# Patient Record
Sex: Female | Born: 1995 | Hispanic: Yes | Marital: Married | Smoking: Never smoker
Health system: Southern US, Community
[De-identification: ages and names within clinical notes are randomized; demographics above are authoritative.]

## PROBLEM LIST (undated history)

## (undated) ENCOUNTER — Inpatient Hospital Stay: Payer: Self-pay

## (undated) DIAGNOSIS — Z862 Personal history of diseases of the blood and blood-forming organs and certain disorders involving the immune mechanism: Secondary | ICD-10-CM

## (undated) HISTORY — DX: Personal history of diseases of the blood and blood-forming organs and certain disorders involving the immune mechanism: Z86.2

## (undated) HISTORY — PX: NO PAST SURGERIES: SHX2092

## (undated) HISTORY — PX: FEMUR FRACTURE SURGERY: SHX633

---
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
## 2017-03-05 IMAGING — US US OB COMP LESS 14 WK
1 series · 13 of 28 positions shown · non-contrast
Comparison: None.

CLINICAL DATA: Acute onset of light vaginal spotting and itching.
Initial encounter.

EXAM:
OBSTETRIC <14 WK US AND TRANSVAGINAL OB US
TECHNIQUE: Both transabdominal and transvaginal ultrasound examinations were
performed for complete evaluation of the gestation as well as the
maternal uterus, adnexal regions, and pelvic cul-de-sac.
Transvaginal technique was performed to assess early pregnancy.

[Series 1: us ob comp less 14 wk · 0.20mm/px · 53 acquisitions, 13 frames shown]
[im 2/53]
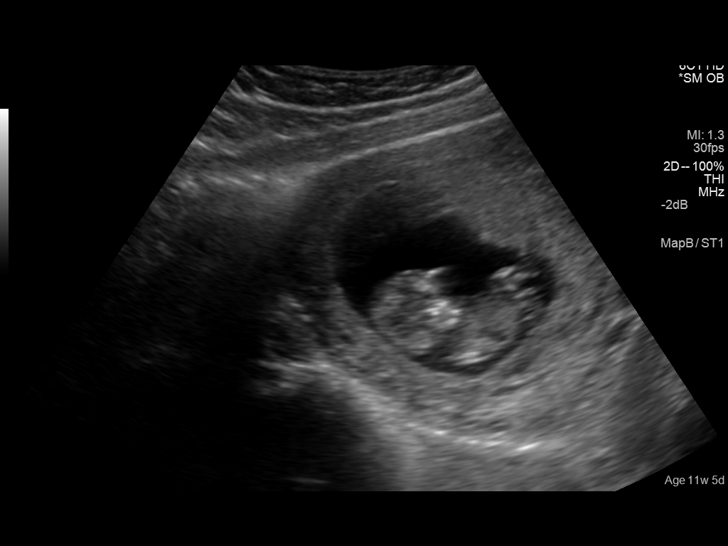
[im 6/53]
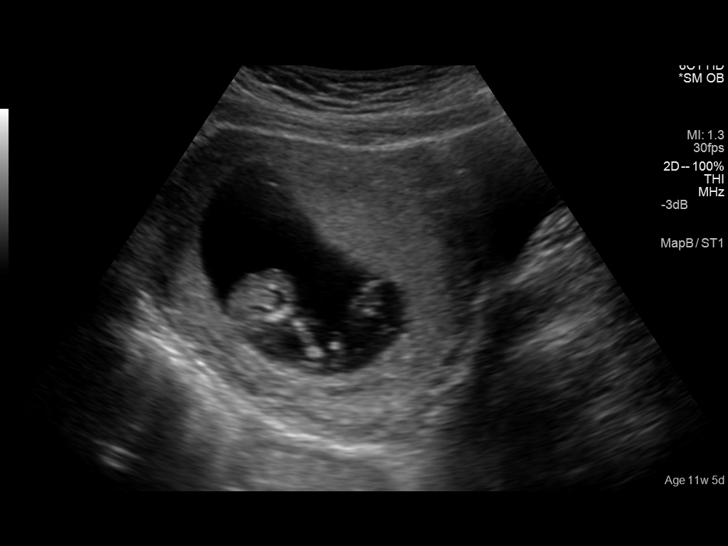
[im 10/53]
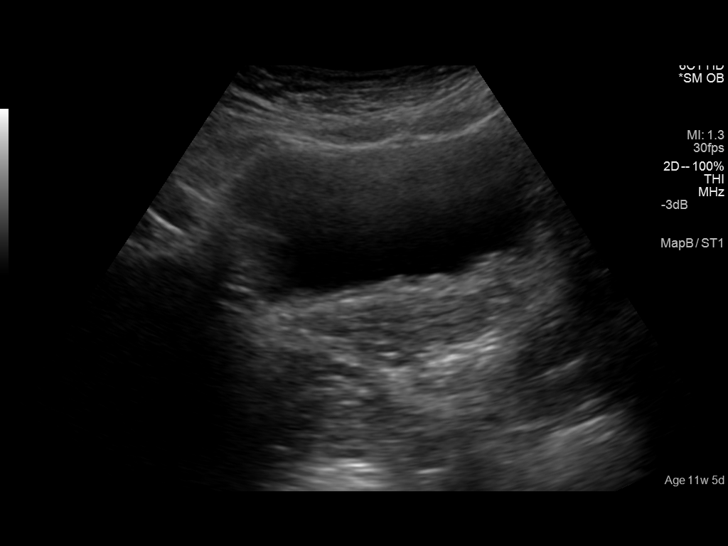
[im 14/53]
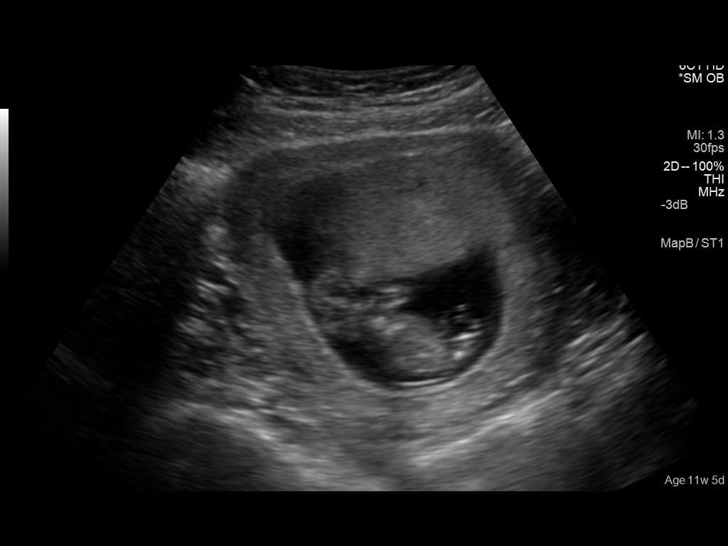
[im 18/53]
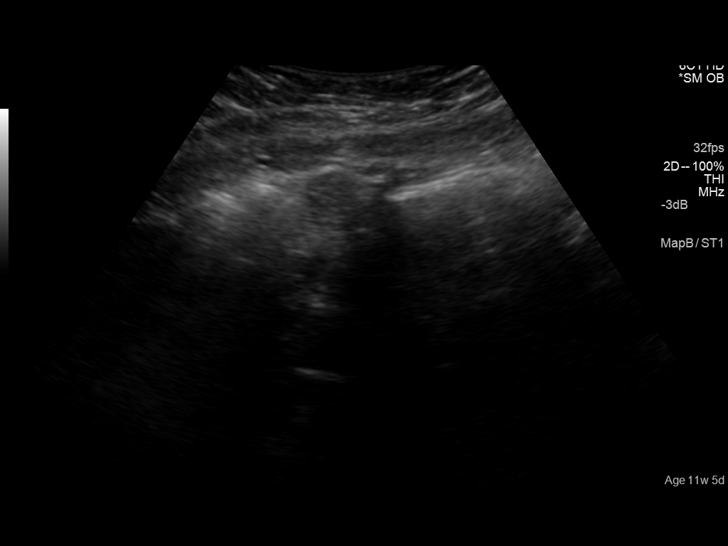
[im 22/53]
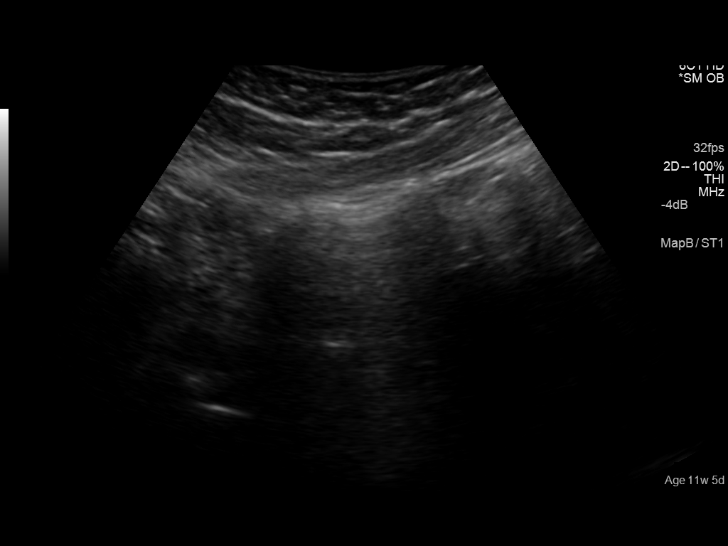
[im 27/53]
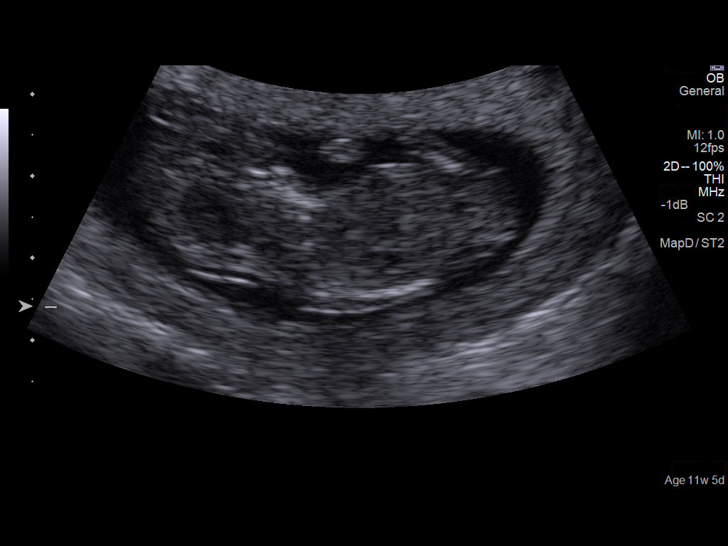
[im 31/53]
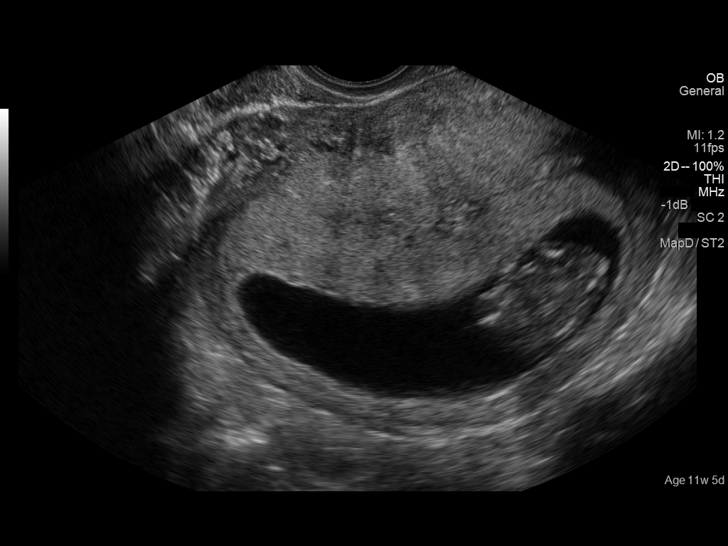
[im 35/53]
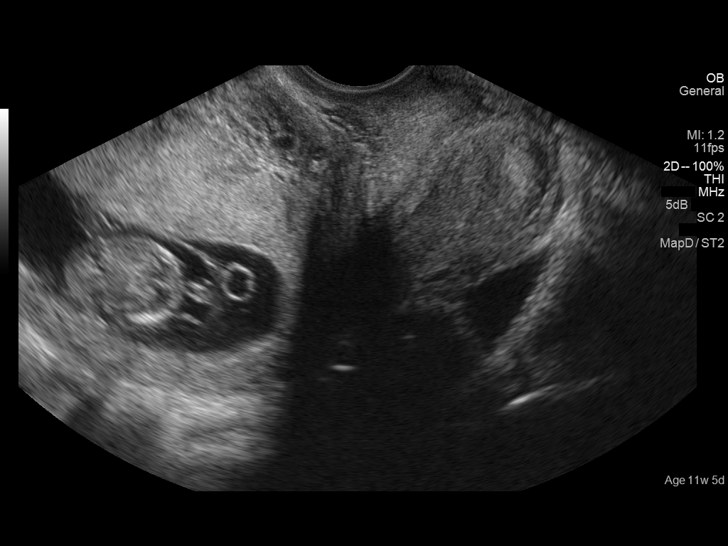
[im 39/53]
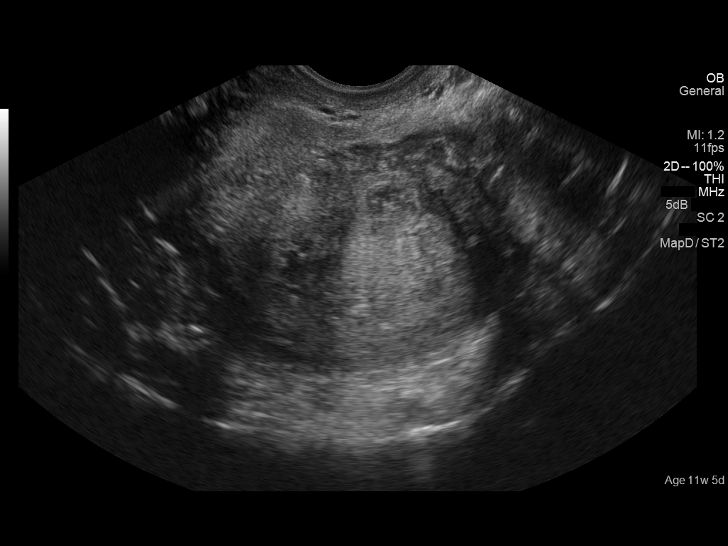
[im 43/53]
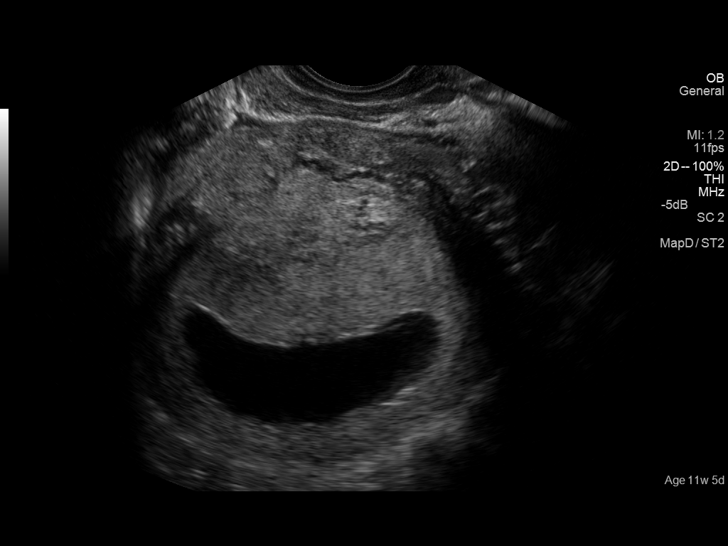
[im 47/53]
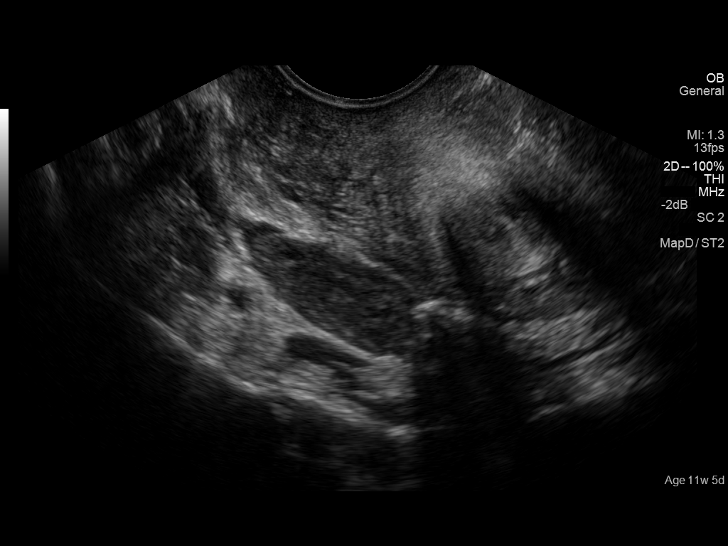
[im 51/53]
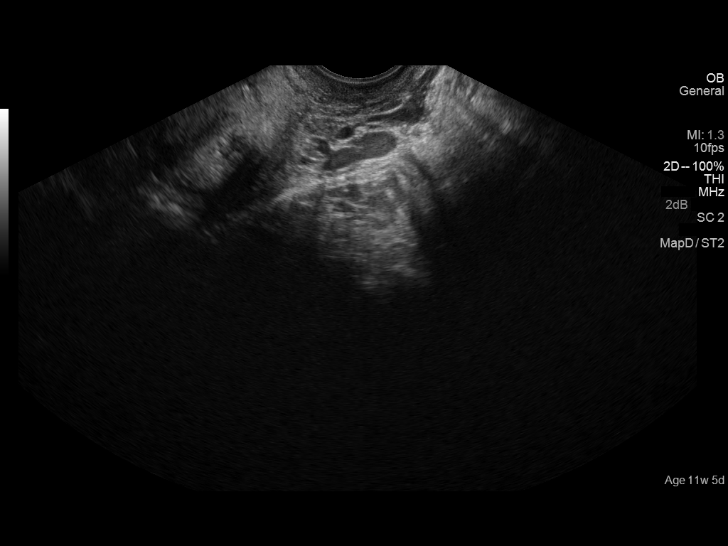

[13 of 28 positions shown; findings below may reference images not displayed]

FINDINGS: Intrauterine gestational sac: Visualized/normal in shape.

Yolk sac:  Yes

Embryo:  Yes

Cardiac Activity: Yes

Heart Rate: 152  bpm

CRL:  4.67 cm   11 w   3 d                  US EDC: 09/07/2015

Maternal uterus/adnexae: No subchorionic hemorrhage is noted. The
uterus is otherwise unremarkable in appearance.

The right ovary is unremarkable in appearance, measuring 2.6 x 1.2 x
1.6 cm. The left ovary is not visualized. No suspicious adnexal
masses are seen. There is no evidence for ovarian torsion.

No free fluid is seen within the pelvic cul-de-sac.
IMPRESSION: Single live intrauterine pregnancy noted, with a crown-rump length
of 4.7 cm, corresponding to a gestational age of 11 weeks 3 days.
This matches the gestational age by LMP of 11 weeks 5 days,
reflecting an estimated date of delivery September 05, 2015.

## 2017-03-16 ENCOUNTER — Ambulatory Visit (INDEPENDENT_AMBULATORY_CARE_PROVIDER_SITE_OTHER): Payer: Medicaid Other

## 2017-03-16 ENCOUNTER — Ambulatory Visit (INDEPENDENT_AMBULATORY_CARE_PROVIDER_SITE_OTHER): Payer: Medicaid Other | Admitting: Advanced Practice Midwife

## 2017-03-16 ENCOUNTER — Encounter: Payer: Self-pay | Admitting: Advanced Practice Midwife

## 2017-03-16 VITALS — BP 94/58 | HR 91 | Ht 60.0 in | Wt 125.0 lb

## 2017-03-16 DIAGNOSIS — Z3042 Encounter for surveillance of injectable contraceptive: Secondary | ICD-10-CM

## 2017-03-16 DIAGNOSIS — Z30013 Encounter for initial prescription of injectable contraceptive: Secondary | ICD-10-CM

## 2017-03-16 MED ORDER — MEDROXYPROGESTERONE ACETATE 150 MG/ML IM SUSP: 150.0000 mg | INTRAMUSCULAR | 3 refills | Status: DC

## 2017-03-16 MED ORDER — MEDROXYPROGESTERONE ACETATE 150 MG/ML IM SUSP
150.0000 mg | Freq: Once | INTRAMUSCULAR | Status: AC
  Administered 2017-03-16: 150 mg via INTRAMUSCULAR

## 2017-03-16 NOTE — Progress Notes (Signed)
S: The patient is here today for birth control conference. She has most recently had an IUD which was removed in September of this year and does not want to have another. She had increased cramping with the IUD. She is interested in trying Depo Provera. Discussion of various types of birth control including, LARCs, pill, patch, ring, injection, condoms. Her questions are answered and she chooses Depo. She plans to pick up the prescription and come back in this afternoon for her first injection. She had an annual exam in September. She does not have concerns for STDs or for pregnancy as she has not been sexually active in the last few months. She does not use tobacco products.  O: BP (!) 94/58 (BP Location: Left Arm, Patient Position: Sitting, Cuff Size: Normal)   Pulse 91   Ht 5' (1.524 m)   Wt 125 lb (56.7 kg)   LMP 02/25/2017   BMI 24.41 kg/m   A: 21 yo female in office for birth control consultation  P: Rx for Depo Provera Return to clinic for PRN and annual exam  Tresea MallJane Joleigh Mineau, CNM

## 2017-06-08 ENCOUNTER — Ambulatory Visit (INDEPENDENT_AMBULATORY_CARE_PROVIDER_SITE_OTHER): Payer: Medicaid Other

## 2017-06-08 DIAGNOSIS — Z3042 Encounter for surveillance of injectable contraceptive: Secondary | ICD-10-CM | POA: Diagnosis not present

## 2017-06-08 MED ORDER — MEDROXYPROGESTERONE ACETATE 150 MG/ML IM SUSP
150.0000 mg | Freq: Once | INTRAMUSCULAR | Status: AC
  Administered 2017-06-08: 150 mg via INTRAMUSCULAR

## 2017-06-19 ENCOUNTER — Encounter: Payer: Medicaid Other | Admitting: Obstetrics and Gynecology

## 2017-08-31 ENCOUNTER — Ambulatory Visit (INDEPENDENT_AMBULATORY_CARE_PROVIDER_SITE_OTHER): Payer: Medicaid Other

## 2017-08-31 ENCOUNTER — Ambulatory Visit: Payer: Medicaid Other

## 2017-08-31 DIAGNOSIS — Z3042 Encounter for surveillance of injectable contraceptive: Secondary | ICD-10-CM

## 2017-08-31 MED ORDER — MEDROXYPROGESTERONE ACETATE 150 MG/ML IM SUSP
150.0000 mg | Freq: Once | INTRAMUSCULAR | Status: AC
  Administered 2017-08-31: 150 mg via INTRAMUSCULAR

## 2017-09-18 IMAGING — US US ABDOMEN LIMITED
1 series · 14 of 25 positions shown · non-contrast
Comparison: None.

CLINICAL DATA: 21-year-old female with right upper quadrant pain x2
days.

EXAM:
US ABDOMEN LIMITED - RIGHT UPPER QUADRANT

[Series 1: us abdomen limited · 0.26mm/px · 14 of 62 slices shown]
[im 1/62]
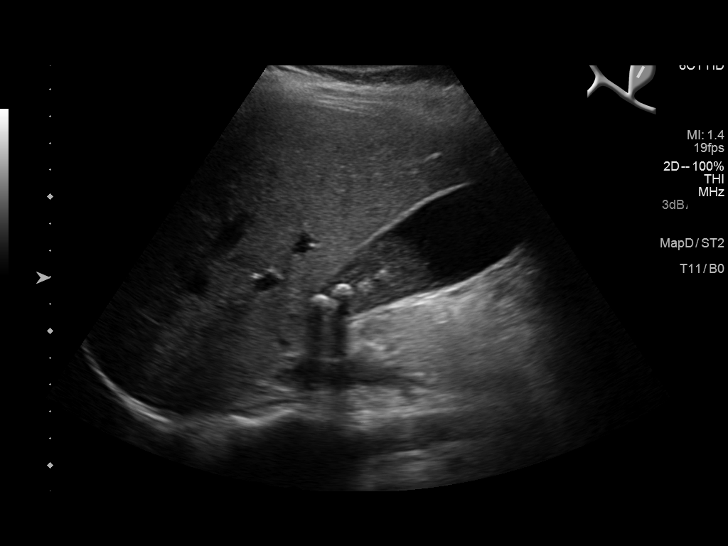
[im 6/62]
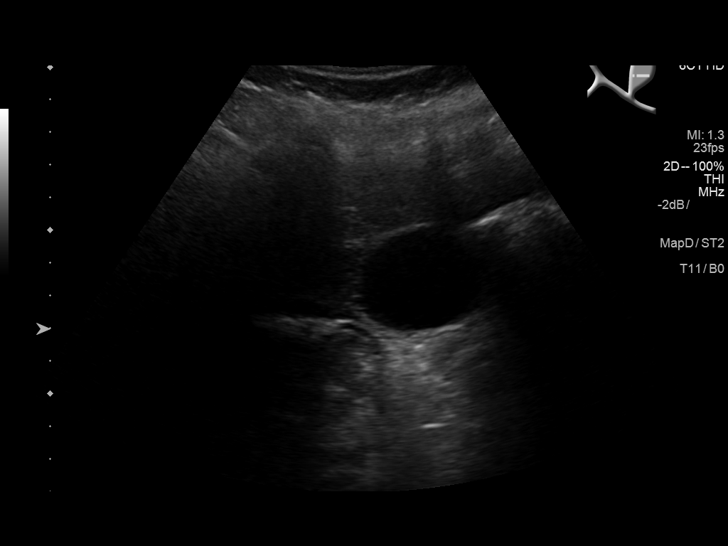
[im 11/62]
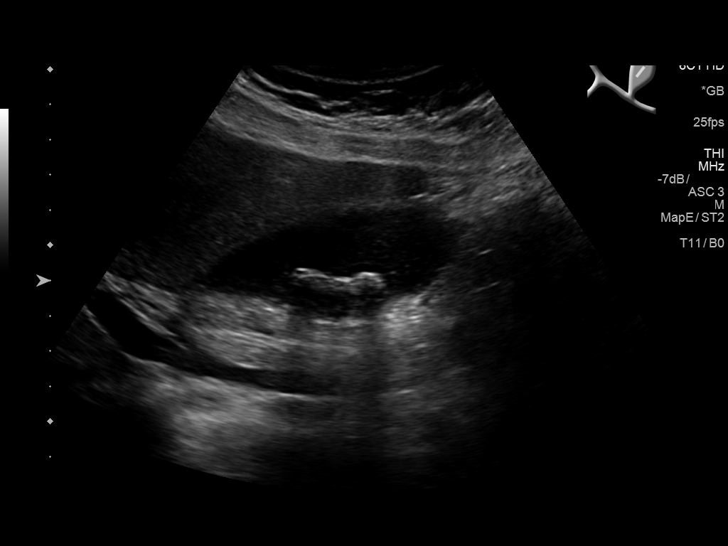
[im 16/62]
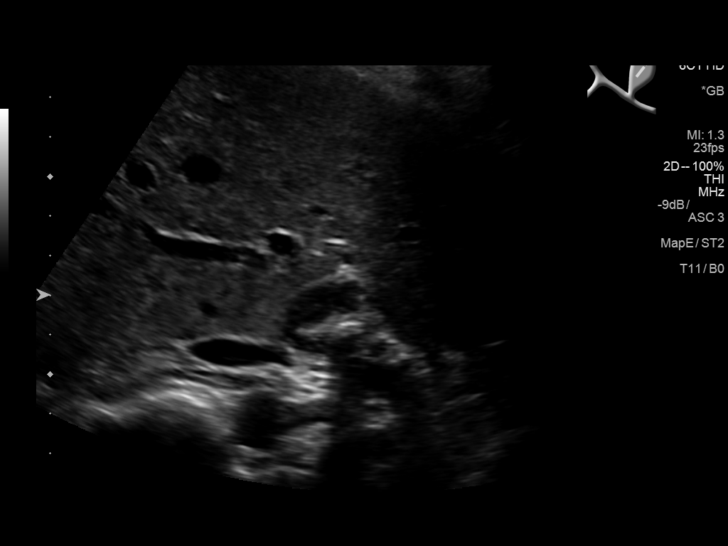
[im 21/62]
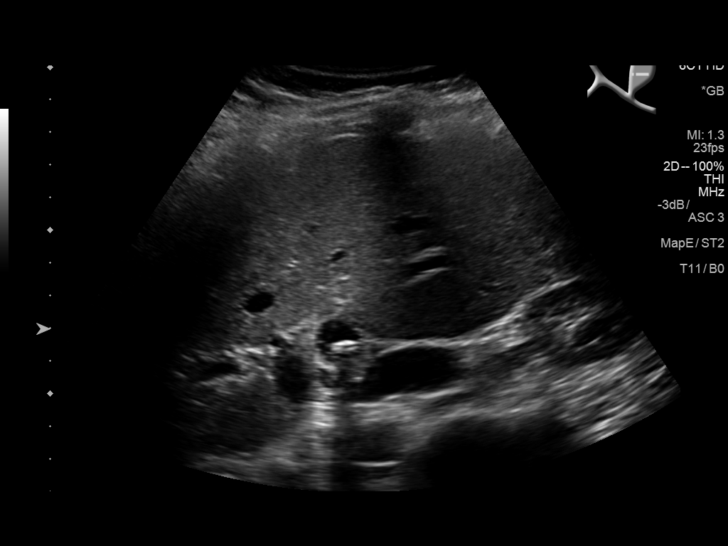
[im 23/62]
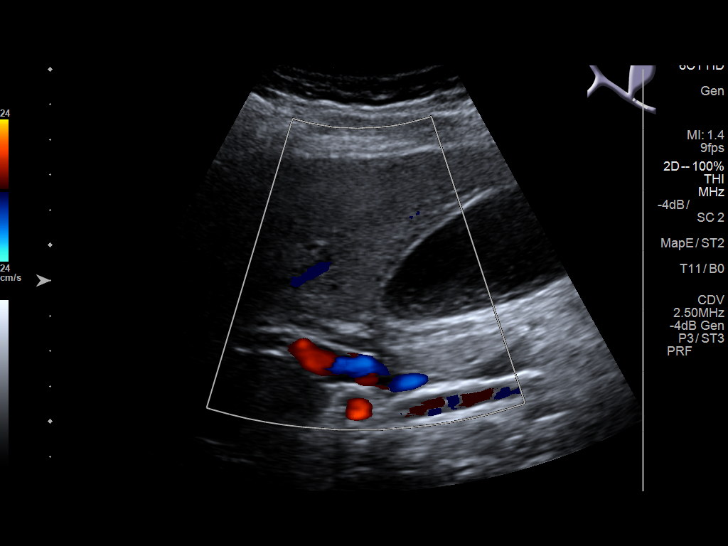
[im 28/62]
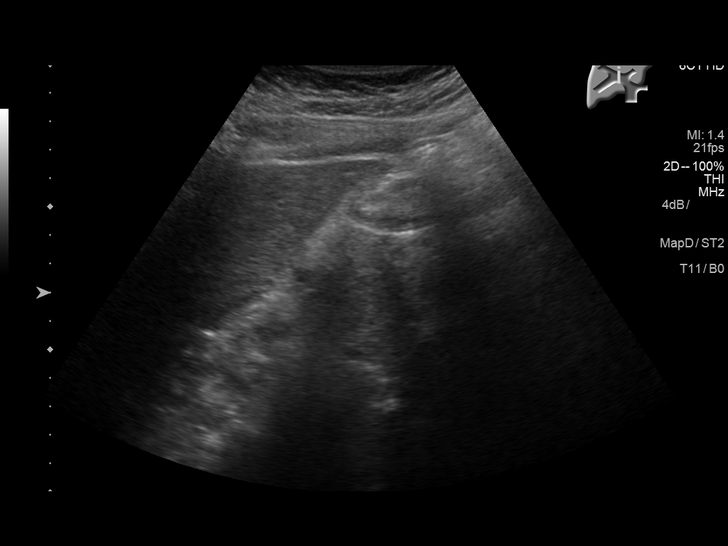
[im 34/62]
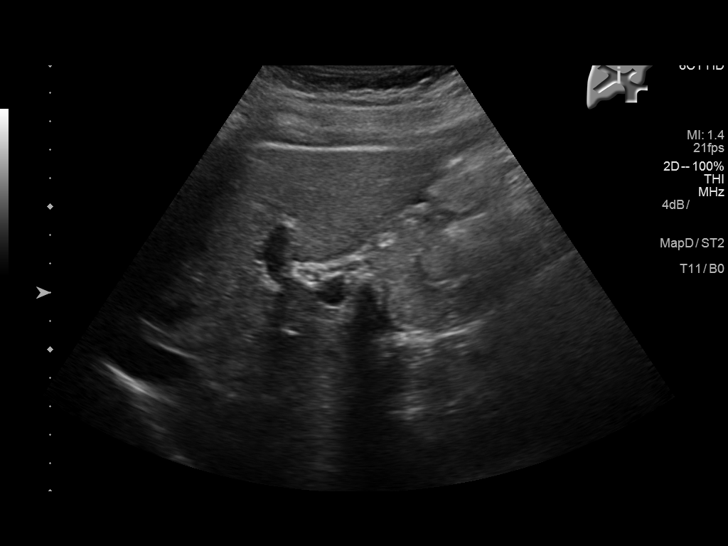
[im 39/62]
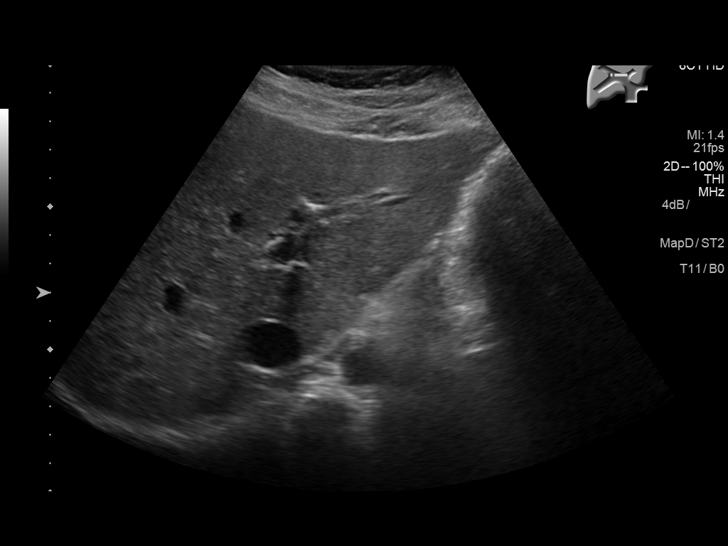
[im 41/62]
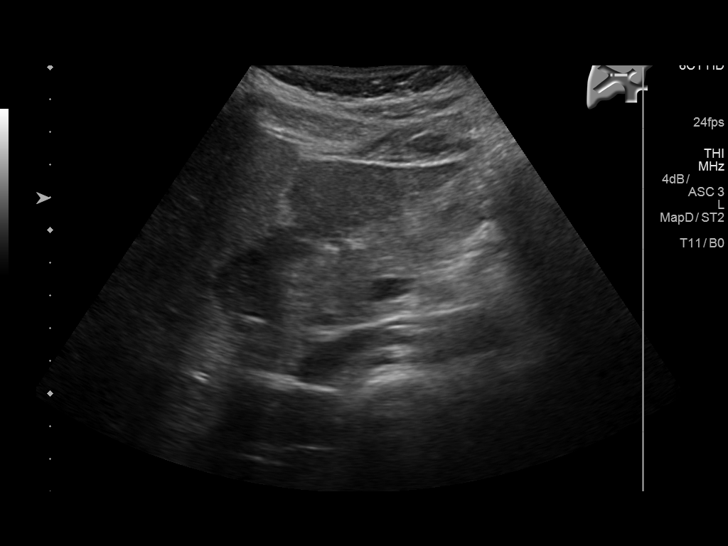
[im 46/62]
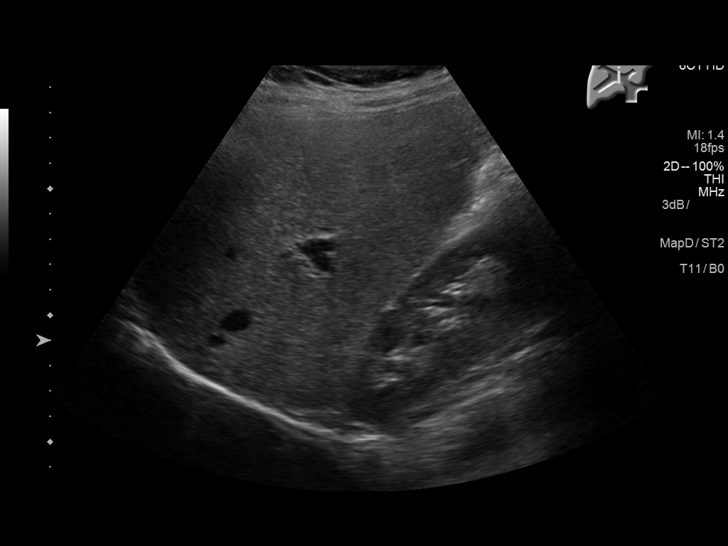
[im 51/62]
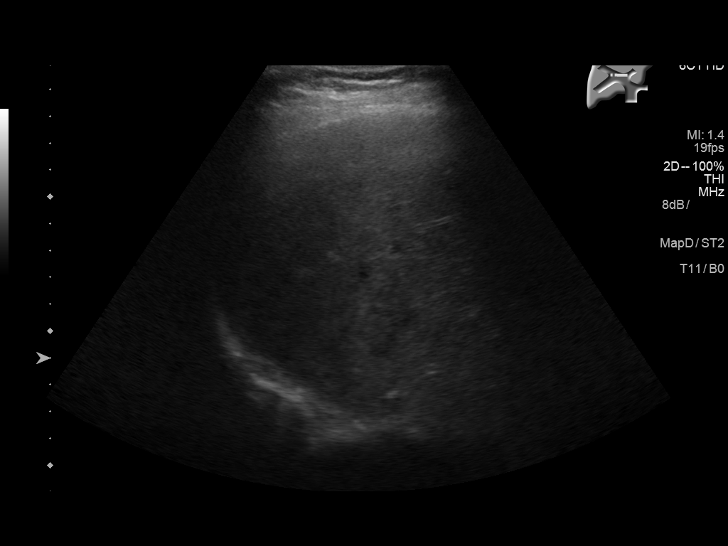
[im 56/62]
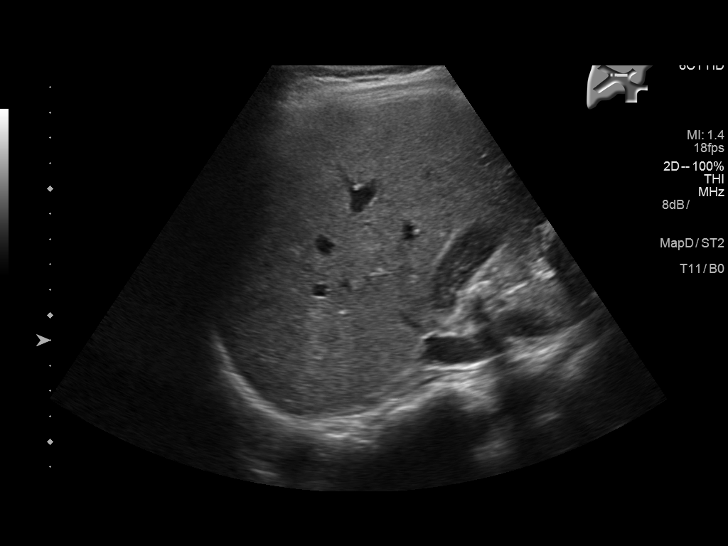
[im 62/62]
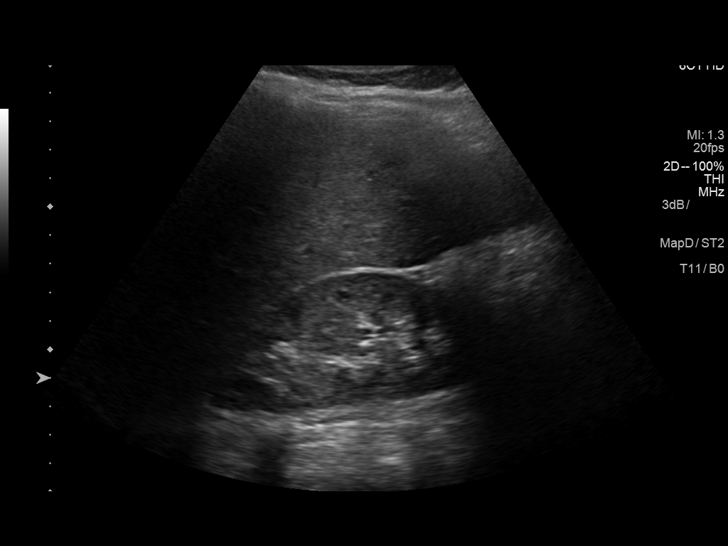

[14 of 25 positions shown; findings below may reference images not displayed]

FINDINGS: Gallbladder:

There multiple stones as well as sludge within the gallbladder.
There are two impacted stones in the gallbladder neck. There is no
gallbladder wall thickening or pericholecystic fluid. Negative
sonographic Murphy's sign.

Common bile duct:

Diameter: 4 mm

Liver:

No focal lesion identified. Within normal limits in parenchymal
echogenicity. The main portal vein is patent with appropriate flow
direction.
IMPRESSION: Cholelithiasis with impacted stones in the gallbladder neck. No
sonographic evidence of acute cholecystitis. A hepatobiliary
scintigraphy may provide better evaluation of the gallbladder if
there is a high clinical concern for acute cholecystitis .

## 2017-11-23 ENCOUNTER — Ambulatory Visit (INDEPENDENT_AMBULATORY_CARE_PROVIDER_SITE_OTHER): Payer: Medicaid Other

## 2017-11-23 ENCOUNTER — Ambulatory Visit: Payer: Medicaid Other

## 2017-11-23 DIAGNOSIS — Z3042 Encounter for surveillance of injectable contraceptive: Secondary | ICD-10-CM

## 2017-11-23 DIAGNOSIS — Z308 Encounter for other contraceptive management: Secondary | ICD-10-CM

## 2017-11-23 MED ORDER — MEDROXYPROGESTERONE ACETATE 150 MG/ML IM SUSP
150.0000 mg | Freq: Once | INTRAMUSCULAR | Status: AC
  Administered 2017-11-23: 150 mg via INTRAMUSCULAR

## 2017-11-23 NOTE — Progress Notes (Signed)
Pt here for depo which was given IM right glut.  NDC# [address]-2 

## 2018-02-15 ENCOUNTER — Ambulatory Visit: Payer: Medicaid Other

## 2018-04-03 HISTORY — PX: FEMUR FRACTURE SURGERY: SHX633

## 2018-05-09 ENCOUNTER — Ambulatory Visit: Payer: Medicaid Other

## 2018-05-10 ENCOUNTER — Telehealth: Payer: Self-pay | Admitting: Advanced Practice Midwife

## 2018-05-10 ENCOUNTER — Other Ambulatory Visit: Payer: Self-pay | Admitting: Advanced Practice Midwife

## 2018-05-10 ENCOUNTER — Ambulatory Visit: Payer: Medicaid Other

## 2018-05-10 DIAGNOSIS — Z30013 Encounter for initial prescription of injectable contraceptive: Secondary | ICD-10-CM

## 2018-05-10 NOTE — Telephone Encounter (Signed)
Patient is schedule for 05/24/18 with Annual with Erskine SquibbJane. Patient is requesting refill on depo. Please advise

## 2018-05-13 ENCOUNTER — Other Ambulatory Visit: Payer: Self-pay

## 2018-05-13 ENCOUNTER — Ambulatory Visit (INDEPENDENT_AMBULATORY_CARE_PROVIDER_SITE_OTHER): Payer: Medicaid Other

## 2018-05-13 DIAGNOSIS — Z3202 Encounter for pregnancy test, result negative: Secondary | ICD-10-CM

## 2018-05-13 DIAGNOSIS — Z3042 Encounter for surveillance of injectable contraceptive: Secondary | ICD-10-CM

## 2018-05-13 DIAGNOSIS — N912 Amenorrhea, unspecified: Secondary | ICD-10-CM

## 2018-05-13 DIAGNOSIS — Z30013 Encounter for initial prescription of injectable contraceptive: Secondary | ICD-10-CM

## 2018-05-13 LAB — POCT URINE PREGNANCY: PREG TEST UR: NEGATIVE

## 2018-05-13 MED ORDER — MEDROXYPROGESTERONE ACETATE 150 MG/ML IM SUSY: 1.0000 mL | PREFILLED_SYRINGE | INTRAMUSCULAR | 0 refills | Status: DC

## 2018-05-13 MED ORDER — MEDROXYPROGESTERONE ACETATE 150 MG/ML IM SUSP
150.0000 mg | Freq: Once | INTRAMUSCULAR | Status: AC
  Administered 2018-05-13: 150 mg via INTRAMUSCULAR

## 2018-05-13 NOTE — Telephone Encounter (Signed)
Refill sent.

## 2018-05-13 NOTE — Progress Notes (Signed)
Pt presents late for Depo Provera injection today. Pregnancy test performed per protocol (Negative). Injection given IM LUOQ. Pt tolerated well.

## 2018-05-24 ENCOUNTER — Ambulatory Visit: Payer: Medicaid Other | Admitting: Advanced Practice Midwife

## 2018-08-05 ENCOUNTER — Telehealth: Payer: Self-pay | Admitting: Advanced Practice Midwife

## 2018-08-05 ENCOUNTER — Other Ambulatory Visit: Payer: Self-pay | Admitting: Obstetrics and Gynecology

## 2018-08-05 ENCOUNTER — Ambulatory Visit: Payer: Medicaid Other

## 2018-08-05 DIAGNOSIS — Z30013 Encounter for initial prescription of injectable contraceptive: Secondary | ICD-10-CM

## 2018-08-05 NOTE — Telephone Encounter (Signed)
Advise

## 2018-08-05 NOTE — Telephone Encounter (Signed)
Patient is schedule 08/09/18 for Depo injection appointment. Patient is requesting refill on Depo. Please advise

## 2018-08-06 ENCOUNTER — Other Ambulatory Visit: Payer: Self-pay

## 2018-08-06 DIAGNOSIS — Z30013 Encounter for initial prescription of injectable contraceptive: Secondary | ICD-10-CM

## 2018-08-06 MED ORDER — MEDROXYPROGESTERONE ACETATE 150 MG/ML IM SUSY: 1.0000 mL | PREFILLED_SYRINGE | INTRAMUSCULAR | 0 refills | Status: DC

## 2018-08-09 ENCOUNTER — Ambulatory Visit: Payer: Medicaid Other

## 2019-07-14 ENCOUNTER — Encounter: Payer: Self-pay | Admitting: Obstetrics & Gynecology

## 2019-07-14 ENCOUNTER — Other Ambulatory Visit (HOSPITAL_COMMUNITY)
Admission: RE | Admit: 2019-07-14 | Discharge: 2019-07-14 | Disposition: A | Discharge status: Home / Self Care | Payer: Medicaid Other | Source: Ambulatory Visit | Attending: Obstetrics & Gynecology | Admitting: Obstetrics & Gynecology

## 2019-07-14 ENCOUNTER — Other Ambulatory Visit: Payer: Self-pay

## 2019-07-14 ENCOUNTER — Ambulatory Visit (INDEPENDENT_AMBULATORY_CARE_PROVIDER_SITE_OTHER): Payer: Medicaid Other | Admitting: Obstetrics & Gynecology

## 2019-07-14 VITALS — BP 100/60 | Ht 59.0 in | Wt 165.0 lb

## 2019-07-14 DIAGNOSIS — Z124 Encounter for screening for malignant neoplasm of cervix: Secondary | ICD-10-CM | POA: Diagnosis not present

## 2019-07-14 DIAGNOSIS — Z0000 Encounter for general adult medical examination without abnormal findings: Secondary | ICD-10-CM

## 2019-07-14 DIAGNOSIS — N979 Female infertility, unspecified: Secondary | ICD-10-CM | POA: Diagnosis not present

## 2019-07-14 DIAGNOSIS — Z01419 Encounter for gynecological examination (general) (routine) without abnormal findings: Secondary | ICD-10-CM | POA: Diagnosis not present
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
## 2019-07-14 NOTE — Progress Notes (Signed)
HPI:      Ms. Susan Kent is a 24 y.o. G1P1001 who LMP was Patient's last menstrual period was 06/30/2019., she presents today for her annual examination. The patient has no complaints today. The patient is sexually active. Her no prior history of gyn screening tests. The patient does perform self breast exams.  There is no notable family history of breast or ovarian cancer in her family.  The patient has regular exercise: yes.  The patient denies current symptoms of depression.    GYN History: Contraception: none  NSVD 4 yrs ago  PMHx: Past Medical History:  Diagnosis Date  . Postpartum hemorrhage, delivered, current hospitalization 09/05/2015   Past Surgical History:  Procedure Laterality Date  . CHOLECYSTECTOMY N/A 07/28/2016   Procedure: LAPAROSCOPIC CHOLECYSTECTOMY;  Surgeon: Henrene Dodge, MD;  Location: [facility] ORS;  Service: General;  Laterality: N/A;   Family History  Problem Relation Age of Onset  . Hypertension Mother   . Migraines Mother   . Hypertension Sister    Social History   Tobacco Use  . Smoking status: Never Smoker  . Smokeless tobacco: Never Used  Substance Use Topics  . Alcohol use: No    Alcohol/week: 0.0 standard drinks  . Drug use: No   Current Outpatient Medications: none Allergies: Patient has no known allergies.  Review of Systems  Constitutional: Negative for chills, fever and malaise/fatigue.  HENT: Negative for congestion, sinus pain and sore throat.   Eyes: Negative for blurred vision and pain.  Respiratory: Negative for cough and wheezing.   Cardiovascular: Negative for chest pain and leg swelling.  Gastrointestinal: Negative for abdominal pain, constipation, diarrhea, heartburn, nausea and vomiting.  Genitourinary: Negative for dysuria, frequency, hematuria and urgency.  Musculoskeletal: Negative for back pain, joint pain, myalgias and neck pain.  Skin: Negative for itching and rash.  Neurological: Negative for dizziness, tremors and  weakness.  Endo/Heme/Allergies: Does not bruise/bleed easily.  Psychiatric/Behavioral: Negative for depression. The patient is not nervous/anxious and does not have insomnia.     Objective: BP 100/60   Ht 4\' 11"  (1.499 m)   Wt 165 lb (74.8 kg)   LMP 06/30/2019   BMI 33.33 kg/m   Filed Weights   07/14/19 0927  Weight: 165 lb (74.8 kg)   Body mass index is 33.33 kg/m. Physical Exam Constitutional:      General: She is not in acute distress.    Appearance: She is well-developed.  Genitourinary:     Pelvic exam was performed with patient supine.     Vagina, uterus and rectum normal.     No lesions in the vagina.     No vaginal bleeding.     No cervical motion tenderness, friability, lesion or polyp.     Uterus is mobile.     Uterus is not enlarged.     No uterine mass detected.    Uterus is midaxial.     No right or left adnexal mass present.     Right adnexa not tender.     Left adnexa not tender.  HENT:     Head: Normocephalic and atraumatic. No laceration.     Right Ear: Hearing normal.     Left Ear: Hearing normal.     Mouth/Throat:     Pharynx: Uvula midline.  Eyes:     Pupils: Pupils are equal, round, and reactive to light.  Neck:     Thyroid: No thyromegaly.  Cardiovascular:     Rate and Rhythm: Normal rate  and regular rhythm.     Heart sounds: No murmur. No friction rub. No gallop.   Pulmonary:     Effort: Pulmonary effort is normal. No respiratory distress.     Breath sounds: Normal breath sounds. No wheezing.  Chest:     Breasts:        Right: No mass, skin change or tenderness.        Left: No mass, skin change or tenderness.  Abdominal:     General: Bowel sounds are normal. There is no distension.     Palpations: Abdomen is soft.     Tenderness: There is no abdominal tenderness. There is no rebound.  Musculoskeletal:        General: Normal range of motion.     Cervical back: Normal range of motion and neck supple.  Neurological:     Mental Status:  She is alert and oriented to person, place, and time.     Cranial Nerves: No cranial nerve deficit.  Skin:    General: Skin is warm and dry.  Psychiatric:        Judgment: Judgment normal.  Vitals reviewed.     Assessment:  ANNUAL EXAM 1. Women's annual routine gynecological examination   2. Screening for cervical cancer   3. Infertility, female      Screening Plan:            1.  Cervical Screening-  Pap smear done today  2.  Counseling for contraception: none desired; desires pregnancy; see infertility note, as this was an additional extensive discussion exam and plan for investigation     F/U  Return in about 4 weeks (around 08/11/2019) for Follow up.  Barnett Applebaum, MD, [facility], [facility] 07/14/2019  10:07 AM

## 2019-07-14 NOTE — Patient Instructions (Signed)
Female Infertility  Female infertility refers to a woman's inability to get pregnant (conceive) after a year of having sex regularly (or after 6 months in women over age 22) without using birth control. Infertility can also mean that a woman is not able to carry a pregnancy to full term. Both women and men can have fertility problems. What are the causes? This condition may be caused by:  Problems with reproductive organs. Infertility can result if a woman: ? Has an abnormally short cervix or a cervix that does not remain closed during a pregnancy. ? Has a blockage or scarring in the fallopian tubes. ? Has an abnormally shaped uterus. ? Has uterine fibroids. This is a benign mass of tissue or muscle (tumor) that can develop in the uterus. ? Is not ovulating in a regular way.  Certain medical conditions. These may include: ? Polycystic ovary syndrome (PCOS). This is a hormonal disorder that can cause small cysts to grow on the ovaries. This is the most common cause of infertility in women. ? Endometriosis. This is a condition in which the tissue that lines the uterus (endometrium) grows outside of its normal location. ? Cancer and cancer treatments, such as chemotherapy or radiation. ? Premature ovarian failure. This is when ovaries stop producing eggs and hormones before age 43. ? Sexually transmitted diseases, such as chlamydia or gonorrhea. ? Autoimmune disorders. These are disorders in which the body's defense system (immune system) attacks normal, healthy cells. Infertility can be linked to more than one cause. For some women, the cause of infertility is not known (unexplained infertility). What increases the risk?  Age. A woman's fertility declines with age, especially after her mid-62s.  Being underweight or overweight.  Drinking too much alcohol.  Using drugs such as anabolic steroids, cocaine, and marijuana.  Exercising excessively.  Being exposed to environmental toxins,  such as radiation, pesticides, and certain chemicals. What are the signs or symptoms? The main sign of infertility in women is the inability to get pregnant or carry a pregnancy to full term. How is this diagnosed? This condition may be diagnosed by:  Checking whether you are ovulating each month. The tests may include: ? Blood tests to check hormone levels. ? An ultrasound of the ovaries. ? Taking a small tissue that lines the uterus and checking it under a microscope (endometrial biopsy).  Doing additional tests. This is done if ovulation is normal. Tests may include: ? Hysterosalpingography. This X-ray test can show the shape of the uterus and whether the fallopian tubes are open. ? Laparoscopy. This test uses a lighted tube (laparoscope) to look for problems in the fallopian tubes and other organs. ? Transvaginal ultrasound. This imaging test is used to check for abnormalities in the uterus and ovaries. ? Hysteroscopy. This test uses a lighted tube to check for problems in the cervix and the uterus. To be diagnosed with infertility, both partners will have a physical exam. Both partners will also have an extensive medical and sexual history taken. Additional tests may be done. How is this treated? Treatment depends on the cause of infertility. Most cases of infertility in women are treated with medicine or surgery.  Women may take medicine to: ? Correct ovulation problems. ? Treat other health conditions.  Surgery may be done to: ? Repair damage to the ovaries, fallopian tubes, cervix, or uterus. ? Remove growths from the uterus. ? Remove scar tissue from the uterus, pelvis, or other organs. Assisted reproductive technology (ART) Assisted reproductive technology (  ART) refers to all treatments and procedures that combine eggs and sperm outside the body to try to help a couple conceive. ART is often combined with fertility drugs to stimulate ovulation. Sometimes ART is done using eggs  retrieved from another woman's body (donor eggs) or from previously frozen fertilized eggs (embryos). There are different types of ART. These include:  Intrauterine insemination (IUI). A long, thin tube is used to place sperm directly into a woman's uterus. This procedure: ? Is effective for infertility caused by sperm problems, including low sperm count and low motility. ? Can be used in combination with fertility drugs.  In vitro fertilization (IVF). This is done when a woman's fallopian tubes are blocked or when a man has low sperm count. In this procedure: ? Fertility drugs are used to stimulate the ovaries to produce multiple eggs. ? Once mature, these eggs are removed from the body and combined with the sperm to be fertilized. ? The fertilized eggs are then placed into the woman's uterus. Follow these instructions at home:  Take over-the-counter and prescription medicines only as told by your health care provider.  Do not use any products that contain nicotine or tobacco, such as cigarettes and e-cigarettes. If you need help quitting, ask your health care provider.  If you drink alcohol, limit how much you have to 1 drink a day.  Make dietary changes to lose weight or maintain a healthy weight. Work with your health care provider and a dietitian to set a weight-loss goal that is healthy and reasonable for you.  Seek support from a counselor or support group to talk about your concerns related to infertility. Couples counseling may be helpful for you and your partner.  Practice stress reduction techniques that work well for you, such as regular physical activity, meditation, or deep breathing.  Keep all follow-up visits as told by your health care provider. This is important. Contact a health care provider if you:  Feel that stress is interfering with your life and relationships.  Have side effects from treatments for infertility. Summary  Female infertility refers to a woman's  inability to get pregnant (conceive) after a year of having sex regularly (or after 6 months in women over age 31) without using birth control.  To be diagnosed with infertility, both partners will have a physical exam. Both partners will also have an extensive medical and sexual history taken.  Seek support from a counselor or support group to talk about your concerns related to infertility. Couples counseling may be helpful for you and your partner. This information is not intended to replace advice given to you by your health care provider. Make sure you discuss any questions you have with your health care provider. Document Revised: 07/11/2018 Document Reviewed: 02/19/2017 Elsevier Patient Education  2020 [employer].   Hysterosalpingography  Hysterosalpingography is a procedure in which a doctor looks inside a woman's womb (uterus) and fallopian tubes. During the procedure, a dye is injected into the womb. Then X-rays are taken. The dye makes the womb show up on X-rays. What happens before the procedure?  Schedule the procedure after your period stops, but before your next ovulation. This is usually between day 5 and day 10 of your last period. Day 1 is the first day of your period.  Ask your doctor about changing or stopping your normal medicines. This is important if you take diabetes medicines or blood thinners.  Pee before the procedure starts.  Plan to have someone take  you home. What happens during the procedure?  You may be given one of these: ? A medicine to help you relax (sedative). ? An over-the-counter pain medicine.  You will lie down on your back. Your feet will be placed in foot rests (stirrups).  A device (speculum) will be placed into your vagina. This lets your doctor see the lower part of your womb (cervix).  Your cervix will be washed with a soap that kills germs.  A medicine may be injected into your cervix to numb it (local anesthesia).  A tube will be  passed into your womb.  Dye will be passed through the tube and into the womb. The dye may cause some cramps.  X-rays will be taken.  The tube will be taken out. The dye will flow out of your vagina on its own. The procedure may vary among doctors and hospitals. What happens after the procedure?  Most of the dye will flow out on its own. Wear a pad if needed.  You may have mild cramping and vaginal bleeding.  Do not drive for 24 hours if you were given a medicine to help you relax.  It is up to you to get the results of your test. Ask your doctor when your results will be ready. Summary  Hysterosalpingography is a procedure in which a doctor looks inside a woman's womb and fallopian tubes.  In this procedure, dye is injected into the womb. Then, X-rays are taken. The dye makes the womb show up on the X-rays.  Plan to have this procedure after your period stops, but before your next ovulation. This is often between days 5 and 10 of your last period. Day 1 is the first day of your period.  After the procedure, you may have mild cramping and bleeding. Most of the dye will flow out on its own. Wear a pad if needed. This information is not intended to replace advice given to you by your health care provider. Make sure you discuss any questions you have with your health care provider. Document Revised: 03/02/2017 Document Reviewed: 04/12/2016 Elsevier Patient Education  2020 [employer].

## 2019-07-16 LAB — CYTOLOGY - PAP
Chlamydia: NEGATIVE
Comment: NEGATIVE
Comment: NORMAL
Diagnosis: NEGATIVE
Neisseria Gonorrhea: NEGATIVE

## 2019-07-18 LAB — FSH/LH
FSH: 5.1 m[IU]/mL
LH: 11.8 m[IU]/mL

## 2019-07-18 LAB — TESTOSTERONE, FREE, TOTAL, SHBG
Sex Hormone Binding: 15.4 nmol/L — ABNORMAL LOW (ref 24.6–122.0)
Testosterone, Free: 3.9 pg/mL (ref 0.0–4.2)
Testosterone: 31 ng/dL (ref 8–48)

## 2019-07-18 LAB — PROLACTIN: Prolactin: 18 ng/mL (ref 4.8–23.3)

## 2019-07-18 LAB — ANTI MULLERIAN HORMONE: ANTI-MULLERIAN HORMONE (AMH): 3.79 ng/mL

## 2019-07-18 LAB — ESTRADIOL: Estradiol: 43.2 pg/mL

## 2019-07-18 LAB — TSH: TSH: 1.35 u[IU]/mL (ref 0.450–4.500)

## 2019-08-04 ENCOUNTER — Telehealth: Payer: Self-pay | Admitting: Obstetrics & Gynecology

## 2019-08-04 NOTE — Telephone Encounter (Signed)
Tried calling pt to remind of HCG tomorrow. No answer, no voicemail. Have been trying to contact pt since scheduling HSG.   Pt has MyChart and can see that the "new appointment" message was read however "appointment reminder" message sent 5/1 has not be opened.

## 2019-08-05 ENCOUNTER — Ambulatory Visit: Payer: Medicaid Other | Attending: Obstetrics & Gynecology

## 2019-08-11 ENCOUNTER — Other Ambulatory Visit: Payer: Self-pay

## 2019-08-11 ENCOUNTER — Ambulatory Visit (INDEPENDENT_AMBULATORY_CARE_PROVIDER_SITE_OTHER): Payer: Medicaid Other | Admitting: Obstetrics & Gynecology

## 2019-08-11 ENCOUNTER — Encounter: Payer: Self-pay | Admitting: Obstetrics & Gynecology

## 2019-08-11 VITALS — BP 120/80 | Ht 59.0 in | Wt 162.0 lb

## 2019-08-11 DIAGNOSIS — Z30013 Encounter for initial prescription of injectable contraceptive: Secondary | ICD-10-CM | POA: Diagnosis not present

## 2019-08-11 DIAGNOSIS — E282 Polycystic ovarian syndrome: Secondary | ICD-10-CM

## 2019-08-11 MED ORDER — MEDROXYPROGESTERONE ACETATE 150 MG/ML IM SUSP: 150.0000 mg | INTRAMUSCULAR | 3 refills | Status: DC

## 2019-08-11 NOTE — Patient Instructions (Signed)
Medroxyprogesterone injection [Contraceptive] What is this medicine? MEDROXYPROGESTERONE (me DROX ee proe JES te rone) contraceptive injections prevent pregnancy. They provide effective birth control for 3 months. Depo-subQ Provera 104 is also used for treating pain related to endometriosis. This medicine may be used for other purposes; ask your health care provider or pharmacist if you have questions. COMMON BRAND NAME(S): Depo-Provera, Depo-subQ Provera 104 What should I tell my health care provider before I take this medicine? They need to know if you have any of these conditions:  frequently drink alcohol  asthma  blood vessel disease or a history of a blood clot in the lungs or legs  bone disease such as osteoporosis  breast cancer  diabetes  eating disorder (anorexia nervosa or bulimia)  high blood pressure  HIV infection or AIDS  kidney disease  liver disease  mental depression  migraine  seizures (convulsions)  stroke  tobacco smoker  vaginal bleeding  an unusual or allergic reaction to medroxyprogesterone, other hormones, medicines, foods, dyes, or preservatives  pregnant or trying to get pregnant  breast-feeding How should I use this medicine? Depo-Provera Contraceptive injection is given into a muscle. Depo-subQ Provera 104 injection is given under the skin. These injections are given by a health care professional. You must not be pregnant before getting an injection. The injection is usually given during the first 5 days after the start of a menstrual period or 6 weeks after delivery of a baby. Talk to your pediatrician regarding the use of this medicine in children. Special care may be needed. These injections have been used in female children who have started having menstrual periods. Overdosage: If you think you have taken too much of this medicine contact a poison control center or emergency room at once. NOTE: This medicine is only for you. Do not  share this medicine with others. What if I miss a dose? Try not to miss a dose. You must get an injection once every 3 months to maintain birth control. If you cannot keep an appointment, call and reschedule it. If you wait longer than 13 weeks between Depo-Provera contraceptive injections or longer than 14 weeks between Depo-subQ Provera 104 injections, you could get pregnant. Use another method for birth control if you miss your appointment. You may also need a pregnancy test before receiving another injection. What may interact with this medicine? Do not take this medicine with any of the following medications:  bosentan This medicine may also interact with the following medications:  aminoglutethimide  antibiotics or medicines for infections, especially rifampin, rifabutin, rifapentine, and griseofulvin  aprepitant  barbiturate medicines such as phenobarbital or primidone  bexarotene  carbamazepine  medicines for seizures like ethotoin, felbamate, oxcarbazepine, phenytoin, topiramate  modafinil  St. John's wort This list may not describe all possible interactions. Give your health care provider a list of all the medicines, herbs, non-prescription drugs, or dietary supplements you use. Also tell them if you smoke, drink alcohol, or use illegal drugs. Some items may interact with your medicine. What should I watch for while using this medicine? This drug does not protect you against HIV infection (AIDS) or other sexually transmitted diseases. Use of this product may cause you to lose calcium from your bones. Loss of calcium may cause weak bones (osteoporosis). Only use this product for more than 2 years if other forms of birth control are not right for you. The longer you use this product for birth control the more likely you will be at risk   for weak bones. Ask your health care professional how you can keep strong bones. You may have a change in bleeding pattern or irregular periods.  Many females stop having periods while taking this drug. If you have received your injections on time, your chance of being pregnant is very low. If you think you may be pregnant, see your health care professional as soon as possible. Tell your health care professional if you want to get pregnant within the next year. The effect of this medicine may last a long time after you get your last injection. What side effects may I notice from receiving this medicine? Side effects that you should report to your doctor or health care professional as soon as possible:  allergic reactions like skin rash, itching or hives, swelling of the face, lips, or tongue  breast tenderness or discharge  breathing problems  changes in vision  depression  feeling faint or lightheaded, falls  fever  pain in the abdomen, chest, groin, or leg  problems with balance, talking, walking  unusually weak or tired  yellowing of the eyes or skin Side effects that usually do not require medical attention (report to your doctor or health care professional if they continue or are bothersome):  acne  fluid retention and swelling  headache  irregular periods, spotting, or absent periods  temporary pain, itching, or skin reaction at site where injected  weight gain This list may not describe all possible side effects. Call your doctor for medical advice about side effects. You may report side effects to FDA at 1-800-FDA-1088. Where should I keep my medicine? This does not apply. The injection will be given to you by a health care professional. NOTE: This sheet is a summary. It may not cover all possible information. If you have questions about this medicine, talk to your doctor, pharmacist, or health care provider.  2020 Elsevier/Gold Standard (2008-04-10 18:37:56)  

## 2019-08-11 NOTE — Progress Notes (Signed)
  Contraception Counseling Patient presents for contraception counseling.  She no lomger desires infertility work up, as she is no longer with her boy friend.  She is not currently sexually active, but knows she will be again someday and wants contraception in place. The patient has no complaints today. The patient is not currently sexually active. Pertinent past medical history: none.  Discussed labs from last visit as well. She did not have HSG done.  Labs c/w PCOS.  She has reg cycles.  LMP 08/01/2019.  PMHx: She  has a past medical history of Postpartum hemorrhage, delivered, current hospitalization (09/05/2015). Also,  has a past surgical history that includes No past surgeries and Cholecystectomy (N/A, 07/28/2016)., family history includes Hypertension in her mother and sister; Migraines in her mother.,  reports that she has never smoked. She has never used smokeless tobacco. She reports that she does not drink alcohol or use drugs.  She has a current medication list which includes the following prescription(s): medroxyprogesterone. Also, has No Known Allergies.  Review of Systems  Constitutional: Negative for chills, fever and malaise/fatigue.  HENT: Negative for congestion, sinus pain and sore throat.   Eyes: Negative for blurred vision and pain.  Respiratory: Negative for cough and wheezing.   Cardiovascular: Negative for chest pain and leg swelling.  Gastrointestinal: Negative for abdominal pain, constipation, diarrhea, heartburn, nausea and vomiting.  Genitourinary: Negative for dysuria, frequency, hematuria and urgency.  Musculoskeletal: Negative for back pain, joint pain, myalgias and neck pain.  Skin: Negative for itching and rash.  Neurological: Negative for dizziness, tremors and weakness.  Endo/Heme/Allergies: Does not bruise/bleed easily.  Psychiatric/Behavioral: Negative for depression. The patient is not nervous/anxious and does not have insomnia.     Objective: BP 120/80    Ht 4\' 11"  (1.499 m)   Wt 162 lb (73.5 kg)   LMP 08/01/2019   BMI 32.72 kg/m  Physical Exam Constitutional:      General: She is not in acute distress.    Appearance: She is well-developed.  Musculoskeletal:        General: Normal range of motion.  Neurological:     Mental Status: She is alert and oriented to person, place, and time.  Skin:    General: Skin is warm and dry.  Vitals reviewed.     ASSESSMENT/PLAN:    Problem List Items Addressed This Visit      ICD-10-CM   1. Encounter for initial prescription of injectable contraceptive  Z30.013   2. PCOS (polycystic ovarian syndrome)  E28.2       Encounter for initial prescription of injectable contraceptive    -  Primary/ New    Options for birth control discussed, info given, all alternatives coundeled. Plans Depo Provera injections.  SE discussed.  She has used in past to her liking.  Future concerns w PCOS discussed, both w cycle control, additional sx's, and future fertility.   A total of 20 minutes were spent face-to-face with the patient as well as preparation, review, communication, and documentation during this encounter.   08/03/2019, MD, Annamarie Major Ob/G[facility], [facility] 08/11/2019  10:06 AM

## 2019-08-12 ENCOUNTER — Ambulatory Visit (INDEPENDENT_AMBULATORY_CARE_PROVIDER_SITE_OTHER): Payer: Medicaid Other

## 2019-08-12 DIAGNOSIS — Z30013 Encounter for initial prescription of injectable contraceptive: Secondary | ICD-10-CM

## 2019-08-12 LAB — POCT URINE PREGNANCY: Preg Test, Ur: NEGATIVE

## 2019-08-12 MED ORDER — MEDROXYPROGESTERONE ACETATE 150 MG/ML IM SUSP
150.0000 mg | Freq: Once | INTRAMUSCULAR | Status: AC
  Administered 2019-08-12: 150 mg via INTRAMUSCULAR

## 2019-08-12 NOTE — Patient Instructions (Signed)
Pt here for initial Depo Provera per [facility]. LMP 08/01/19, Negative pregnancy test in office today. Given LUOQ, pt tolerated well.

## 2019-08-12 NOTE — Addendum Note (Signed)
Addended by: [place], Saahir Prude B on: 08/12/2019 02:28 PM   Modules accepted: Orders

## 2019-11-04 ENCOUNTER — Telehealth: Payer: Self-pay

## 2019-11-04 ENCOUNTER — Ambulatory Visit: Payer: Medicaid Other

## 2019-11-04 NOTE — Telephone Encounter (Signed)
Patient last seen on 08/11/19 for birth control consult. Does patient need an in person visit for [place] to change her birth control method. Please advise

## 2019-11-04 NOTE — Telephone Encounter (Signed)
Pt calling; would like to change bc; currently on depo and spots every day.  [phone] vm not set up yet.

## 2019-11-04 NOTE — Telephone Encounter (Signed)
[facility] Virtual appt

## 2019-11-05 NOTE — Telephone Encounter (Signed)
Pt calling; was told she'd get a call today about changing bc from depo to something else.  [phone]

## 2019-11-05 NOTE — Telephone Encounter (Signed)
Voicemail not set up unable to leave message to have patient call back to be schedule

## 2019-11-06 NOTE — Telephone Encounter (Signed)
Called and offered to schedule patient in office appointment for birthcontrol consult. Patient wishes to call back to be schedule

## 2019-11-13 ENCOUNTER — Ambulatory Visit: Payer: Medicaid Other | Admitting: Obstetrics and Gynecology

## 2020-09-09 ENCOUNTER — Other Ambulatory Visit: Payer: Self-pay

## 2020-09-09 ENCOUNTER — Ambulatory Visit: Payer: Medicaid Other | Admitting: Physician Assistant

## 2020-09-09 ENCOUNTER — Ambulatory Visit (LOCAL_COMMUNITY_HEALTH_CENTER): Payer: Medicaid Other

## 2020-09-09 VITALS — BP 110/66 | HR 83 | Ht 63.0 in | Wt 165.0 lb

## 2020-09-09 DIAGNOSIS — Z3201 Encounter for pregnancy test, result positive: Secondary | ICD-10-CM

## 2020-09-09 LAB — PREGNANCY, URINE: Preg Test, Ur: POSITIVE — AB

## 2020-09-09 MED ORDER — PRENATAL VITAMIN 27-0.8 MG PO TABS: 1.0000 | ORAL_TABLET | Freq: Every day | ORAL | 0 refills | Status: DC

## 2020-09-09 NOTE — Progress Notes (Signed)
Unsure if plans to continue pregnancy and if does, unsure of [facility] provider. Jossie Ng, RN

## 2021-02-08 ENCOUNTER — Other Ambulatory Visit: Payer: Self-pay

## 2021-02-08 ENCOUNTER — Ambulatory Visit (LOCAL_COMMUNITY_HEALTH_CENTER): Payer: Medicaid Other | Admitting: Advanced Practice Midwife

## 2021-02-08 ENCOUNTER — Encounter: Payer: Self-pay | Admitting: Advanced Practice Midwife

## 2021-02-08 VITALS — BP 125/65 | Ht 60.0 in | Wt 165.6 lb

## 2021-02-08 DIAGNOSIS — Z30013 Encounter for initial prescription of injectable contraceptive: Secondary | ICD-10-CM | POA: Diagnosis not present

## 2021-02-08 DIAGNOSIS — Z3202 Encounter for pregnancy test, result negative: Secondary | ICD-10-CM | POA: Diagnosis not present

## 2021-02-08 DIAGNOSIS — Z3009 Encounter for other general counseling and advice on contraception: Secondary | ICD-10-CM | POA: Diagnosis not present

## 2021-02-08 DIAGNOSIS — Z3042 Encounter for surveillance of injectable contraceptive: Secondary | ICD-10-CM

## 2021-02-08 DIAGNOSIS — E669 Obesity, unspecified: Secondary | ICD-10-CM | POA: Insufficient documentation

## 2021-02-08 LAB — PREGNANCY, URINE: Preg Test, Ur: NEGATIVE

## 2021-02-08 LAB — WET PREP FOR TRICH, YEAST, CLUE
Trichomonas Exam: NEGATIVE
Yeast Exam: NEGATIVE

## 2021-02-08 MED ORDER — MEDROXYPROGESTERONE ACETATE 150 MG/ML IM SUSP
150.0000 mg | INTRAMUSCULAR | Status: AC
  Administered 2021-02-08 – 2021-05-03 (×2): 150 mg via INTRAMUSCULAR

## 2021-02-08 NOTE — Progress Notes (Signed)
Pt here for PE and Depo.  Pt notified of negative PT.  Wet prep results reviewed, no treatment required.  Condoms declined.  Depo 150 mg given without any complications.  Pt given reminder card to return in 11-13 weeks for next Depo injections. Berdie Ogren, RN'

## 2021-02-08 NOTE — Progress Notes (Signed)
[facility] [address] Main Number: [phone]    Family Planning Visit- Initial Visit  Subjective:  Susan Kent is a 25 y.o. SHF nonsmoker G2P1001 (34 yo daughter)  being seen today for an initial annual visit and to discuss contraceptive options.  The patient is currently using Condoms for pregnancy prevention. Patient reports she does not want a pregnancy in the next year.  Patient has the following medical conditions has Symptomatic cholelithiasis; Cholelithiases; PCOS (polycystic ovarian syndrome); and Obesity BMI=32.3 on their problem list.  Chief Complaint  Patient presents with   Annual Exam   Contraception    Patient reports here for physical and birth control. Last sex 01/22/21 with condom; with current partner x 2 years; 1 partner in last 3 mo. LMP 01/31/21. Last pap 07/14/19 neg. Last ETOH 08/2020 (1 pina colada) 1x/year. Last DMPA 04/2020. Last dental exam 2x/year. EAB 10/2020 (10 wks). Living with her daughter and her parents. Working 30 hrs/wk. 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   Patient denies cigs, vaping, cigars, MJ  Body mass index is 32.34 kg/m. - Patient is eligible for diabetes screening based on BMI and age >41?  not applicable HA1C ordered? not applicable  Patient reports 1  partner/s in last year. Desires STI screening?  Yes  Has patient been screened once for HCV in the past?  No  No results found for: HCVAB  Does the patient have current drug use (including MJ), have a partner with drug use, and/or has been incarcerated since last result? No  If yes-- Screen for HCV through [facility]   Does the patient meet criteria for HBV testing? No  Criteria:  -Household, sexual or needle sharing contact with HBV -History of drug use -HIV positive -Those with known Hep C   Health Maintenance Due  Topic Date Due   COVID-19 Vaccine (1) Never done   HPV VACCINES (1 - 2-dose series) Never  done   Hepatitis C Screening  Never done   CHLAMYDIA SCREENING  07/13/2020   INFLUENZA VACCINE  11/01/2020    Review of Systems  All other systems reviewed and are negative.  The following portions of the patient's history were reviewed and updated as appropriate: allergies, current medications, past family history, past medical history, past social history, past surgical history and problem list. Problem list updated.   See flowsheet for other program required questions.  Objective:   Vitals:   02/08/21 1022  BP: 125/65  Weight: 165 lb 9.6 oz (75.1 kg)  Height: 5' (1.524 m)    Physical Exam Constitutional:      Appearance: Normal appearance. She is obese.  HENT:     Head: Normocephalic and atraumatic.     Mouth/Throat:     Mouth: Mucous membranes are moist.     Comments: Last dental exam 6 mo ago Eyes:     Conjunctiva/sclera: Conjunctivae normal.  Neck:     Thyroid: No thyroid mass, thyromegaly or thyroid tenderness.  Cardiovascular:     Rate and Rhythm: Normal rate and regular rhythm.  Pulmonary:     Effort: Pulmonary effort is normal.     Breath sounds: Normal breath sounds.  Chest:  Breasts:    Right: Normal.     Left: Normal.  Abdominal:     Palpations: Abdomen is soft.     Comments: Soft without masses or tenderness, fair tone  Genitourinary:    General: Normal vulva.  Exam position: Lithotomy position.     Vagina: Vaginal discharge (white creamy leukorrhea, ph<4.5) present.     Cervix: Normal.     Uterus: Normal.      Adnexa: Right adnexa normal and left adnexa normal.     Rectum: Normal.  Musculoskeletal:        General: Normal range of motion.     Cervical back: Normal range of motion and neck supple.  Skin:    General: Skin is warm and dry.  Neurological:     Mental Status: She is alert.  Psychiatric:        Mood and Affect: Mood normal.      Assessment and Plan:  Susan Kent is a 25 y.o. female presenting to the [facility] Department for an initial annual wellness/contraceptive visit  Contraception counseling: Reviewed all forms of birth control options in the tiered based approach. available including abstinence; over the counter/barrier methods; hormonal contraceptive medication including pill, patch, ring, injection,contraceptive implant, ECP; hormonal and nonhormonal IUDs; permanent sterilization options including vasectomy and the various tubal sterilization modalities. Risks, benefits, and typical effectiveness rates were reviewed.  Questions were answered.  Written information was also given to the patient to review.  Patient desires DMPA, this was prescribed for patient. She will follow up in  11-13 weeks for surveillance.  She was told to call with any further questions, or with any concerns about this method of contraception.  Emphasized use of condoms 100% of the time for STI prevention.  Patient was offered ECP. ECP was not accepted by the patient. ECP counseling was not given - see RN documentation  1. Encounter for Depo-Provera contraception May have DMPA 150 mg IM q 11-13 wks x 1 year Please counsel on need for abstinance next 7 days - Pregnancy, urine  2. Obesity without serious comorbidity, unspecified classification, unspecified obesity type   3. Family planning Treat wet mount per standing orders Immunization nurse consult - WET PREP FOR TRICH, YEAST, CLUE - Chlamydia/Gonorrhea [place] [facility] - HIV [place] [facility] - Syphilis Serology, [place] [facility]  4. Encounter for initial prescription of injectable contraceptive See above     No follow-ups on file.  No future appointments.  Alberteen Spindle, CNM

## 2021-05-03 ENCOUNTER — Other Ambulatory Visit: Payer: Self-pay

## 2021-05-03 ENCOUNTER — Ambulatory Visit (LOCAL_COMMUNITY_HEALTH_CENTER): Payer: Medicaid Other

## 2021-05-03 VITALS — BP 120/76 | Ht 60.0 in | Wt 170.5 lb

## 2021-05-03 DIAGNOSIS — Z3042 Encounter for surveillance of injectable contraceptive: Secondary | ICD-10-CM | POA: Diagnosis not present

## 2021-05-03 DIAGNOSIS — Z3009 Encounter for other general counseling and advice on contraception: Secondary | ICD-10-CM

## 2021-05-03 NOTE — Progress Notes (Signed)
12 weeks 0 days post depo. Voices no concerns. Depo administered today per order by Hazle Coca, CNM dated 02/08/2021. Tolerated well RUOQ. Next depo due 07/19/2021, has reminder. Jerel Shepherd, RN

## 2022-03-15 ENCOUNTER — Ambulatory Visit (INDEPENDENT_AMBULATORY_CARE_PROVIDER_SITE_OTHER): Payer: Medicaid Other

## 2022-03-15 VITALS — Ht 60.0 in | Wt 180.0 lb

## 2022-03-15 DIAGNOSIS — Z3201 Encounter for pregnancy test, result positive: Secondary | ICD-10-CM

## 2022-03-15 DIAGNOSIS — N912 Amenorrhea, unspecified: Secondary | ICD-10-CM

## 2022-03-15 LAB — POCT URINE PREGNANCY: Preg Test, Ur: POSITIVE — AB

## 2022-03-15 NOTE — Progress Notes (Signed)
Subjective:    Susan Kent is a 26 y.o. female who presents for evaluation of amenorrhea. She believes she could be pregnant. Pregnancy is desired.  Last period was normal.   LMP 01/17/22.  Lab Review Urine HCG: positive    Assessment:    Absence of menstruation.     Plan:    Pregnancy Test:  Positive: EDC: 10/24/22. Briefly discussed positive results and sent to check out for scheduling for New OB appointments.

## 2022-03-22 ENCOUNTER — Ambulatory Visit (INDEPENDENT_AMBULATORY_CARE_PROVIDER_SITE_OTHER): Payer: Medicaid Other

## 2022-03-22 VITALS — Wt 179.0 lb

## 2022-03-22 DIAGNOSIS — Z369 Encounter for antenatal screening, unspecified: Secondary | ICD-10-CM

## 2022-03-22 DIAGNOSIS — Z3480 Encounter for supervision of other normal pregnancy, unspecified trimester: Secondary | ICD-10-CM

## 2022-03-22 NOTE — Progress Notes (Addendum)
New OB Intake  I connected with  Susan Kent on 03/22/22 at  8:15 AM EST by telephone Video Visit and verified that I am speaking with the correct person using two identifiers. Nurse is located at [facility] and pt is located at home.  I explained I am completing New OB Intake today. We discussed her EDD of 10/24/2022 that is based on LMP of 01/17/2022. Pt is G3/P1011. I reviewed her allergies, medications, Medical/Surgical/OB history, and appropriate screenings. Based on history, this is a/an pregnancy uncomplicated .   Patient Active Problem List   Diagnosis Date Noted   Obesity BMI=32.3 02/08/2021   PCOS (polycystic ovarian syndrome) 08/11/2019   Cholelithiases 07/28/2016   Symptomatic cholelithiasis    Supervision of other normal pregnancy, antepartum 04/09/2015    Concerns addressed today None  Delivery Plans:  Plans to deliver at [facility].  Anatomy US Explained first scheduled [place] will be scheduled soon and an anatomy scan will be done at 20 weeks.  Labs Discussed genetic screening with patient. Patient desires genetic testing to be drawn with new OB labs. Discussed possible labs to be drawn at new OB appointment.  COVID Vaccine Patient has had COVID vaccine.   Social Determinants of Health Food Insecurity: denies food insecurity Transportation: Patient denies transportation needs. Childcare: Discussed no children allowed at ultrasound appointments.   First visit review I reviewed new OB appt with pt. I explained she will have ob bloodwork and pap smear/pelvic exam if indicated. Explained pt will be seen by [name], CNM at first visit; encounter routed to appropriate provider.   Loran Senters, [facility] 03/22/2022  8:36 AM

## 2022-03-22 NOTE — Patient Instructions (Signed)
First Trimester of Pregnancy  The first trimester of pregnancy starts on the first day of your last menstrual period until the end of week 12. This is also called months 1 through 3 of pregnancy. Body changes during your first trimester Your body goes through many changes during pregnancy. The changes usually return to normal after your baby is born. Physical changes You may gain or lose weight. Your breasts may grow larger and hurt. The area around your nipples may get darker. Dark spots or blotches may develop on your face. You may have changes in your hair. Health changes You may feel like you might vomit (nauseous), and you may vomit. You may have heartburn. You may have headaches. You may have trouble pooping (constipation). Your gums may bleed. Other changes You may get tired easily. You may pee (urinate) more often. Your menstrual periods will stop. You may not feel hungry. You may want to eat certain kinds of food. You may have changes in your emotions from day to day. You may have more dreams. Follow these instructions at home: Medicines Take over-the-counter and prescription medicines only as told by your doctor. Some medicines are not safe during pregnancy. Take a prenatal vitamin that contains at least 600 micrograms (mcg) of folic acid. Eating and drinking Eat healthy meals that include: Fresh fruits and vegetables. Whole grains. Good sources of protein, such as meat, eggs, or tofu. Low-fat dairy products. Avoid raw meat and unpasteurized juice, milk, and cheese. If you feel like you may vomit, or you vomit: Eat 4 or 5 small meals a day instead of 3 large meals. Try eating a few soda crackers. Drink liquids between meals instead of during meals. You may need to take these actions to prevent or treat trouble pooping: Drink enough fluids to keep your pee (urine) pale yellow. Eat foods that are high in fiber. These include beans, whole grains, and fresh fruits and  vegetables. Limit foods that are high in fat and sugar. These include fried or sweet foods. Activity Exercise only as told by your doctor. Most people can do their usual exercise routine during pregnancy. Stop exercising if you have cramps or pain in your lower belly (abdomen) or low back. Do not exercise if it is too hot or too humid, or if you are in a place of great height (high altitude). Avoid heavy lifting. If you choose to, you may have sex unless your doctor tells you not to. Relieving pain and discomfort Wear a good support bra if your breasts are sore. Rest with your legs raised (elevated) if you have leg cramps or low back pain. If you have bulging veins (varicose veins) in your legs: Wear support hose as told by your doctor. Raise your feet for 15 minutes, 3-4 times a day. Limit salt in your food. Safety Wear your seat belt at all times when you are in a car. Talk with your doctor if someone is hurting you or yelling at you. Talk with your doctor if you are feeling sad or have thoughts of hurting yourself. Lifestyle Do not use hot tubs, steam rooms, or saunas. Do not douche. Do not use tampons or scented sanitary pads. Do not use herbal medicines, illegal drugs, or medicines that are not approved by your doctor. Do not drink alcohol. Do not smoke or use any products that contain nicotine or tobacco. If you need help quitting, ask your doctor. Avoid cat litter boxes and soil that is used by cats. These carry  germs that can cause harm to the baby and can cause a loss of your baby by miscarriage or stillbirth. General instructions Keep all follow-up visits. This is important. Ask for help if you need counseling or if you need help with nutrition. Your doctor can give you advice or tell you where to go for help. Visit your dentist. At home, brush your teeth with a soft toothbrush. Floss gently. Write down your questions. Take them to your prenatal visits. Where to find more  information American Pregnancy Association: americanpregnancy.org American College of Obstetricians and Gynecologists: www.acog.org Office on Women's Health: womenshealth.gov/pregnancy Contact a doctor if: You are dizzy. You have a fever. You have mild cramps or pressure in your lower belly. You have a nagging pain in your belly area. You continue to feel like you may vomit, you vomit, or you have watery poop (diarrhea) for 24 hours or longer. You have a bad-smelling fluid coming from your vagina. You have pain when you pee. You are exposed to a disease that spreads from person to person, such as chickenpox, measles, Zika virus, HIV, or hepatitis. Get help right away if: You have spotting or bleeding from your vagina. You have very bad belly cramping or pain. You have shortness of breath or chest pain. You have any kind of injury, such as from a fall or a car crash. You have new or increased pain, swelling, or redness in an arm or leg. Summary The first trimester of pregnancy starts on the first day of your last menstrual period until the end of week 12 (months 1 through 3). Eat 4 or 5 small meals a day instead of 3 large meals. Do not smoke or use any products that contain nicotine or tobacco. If you need help quitting, ask your doctor. Keep all follow-up visits. This information is not intended to replace advice given to you by your health care provider. Make sure you discuss any questions you have with your health care provider. Document Revised: 08/27/2019 Document Reviewed: 07/03/2019 Elsevier Patient Education  2023 Elsevier Inc. Commonly Asked Questions During Pregnancy  Cats: A parasite can be excreted in cat feces.  To avoid exposure you need to have another person empty the little box.  If you must empty the litter box you will need to wear gloves.  Wash your hands after handling your cat.  This parasite can also be found in raw or undercooked meat so this should also be  avoided.  Colds, Sore Throats, Flu: Please check your medication sheet to see what you can take for symptoms.  If your symptoms are unrelieved by these medications please call the office.  Dental Work: Most any dental work your dentist recommends is permitted.  X-rays should only be taken during the first trimester if absolutely necessary.  Your abdomen should be shielded with a lead apron during all x-rays.  Please notify your provider prior to receiving any x-rays.  Novocaine is fine; gas is not recommended.  If your dentist requires a note from us prior to dental work please call the office and we will provide one for you.  Exercise: Exercise is an important part of staying healthy during your pregnancy.  You may continue most exercises you were accustomed to prior to pregnancy.  Later in your pregnancy you will most likely notice you have difficulty with activities requiring balance like riding a bicycle.  It is important that you listen to your body and avoid activities that put you at a higher   risk of falling.  Adequate rest and staying well hydrated are a must!  If you have questions about the safety of specific activities ask your provider.    Exposure to Children with illness: Try to avoid obvious exposure; report any symptoms to [place] when noted,  If you have chicken pos, red measles or mumps, you should be immune to these diseases.   Please do not take any vaccines while pregnant unless you have checked with your OB provider.  Fetal Movement: After 28 weeks we recommend you do "kick counts" twice daily.  Lie or sit down in a calm quiet environment and count your baby movements "kicks".  You should feel your baby at least 10 times per hour.  If you have not felt 10 kicks within the first hour get up, walk around and have something sweet to eat or drink then repeat for an additional hour.  If count remains less than 10 per hour notify your provider.  Fumigating: Follow your pest control agent's  advice as to how long to stay out of your home.  Ventilate the area well before re-entering.  Hemorrhoids:   Most over-the-counter preparations can be used during pregnancy.  Check your medication to see what is safe to use.  It is important to use a stool softener or fiber in your diet and to drink lots of liquids.  If hemorrhoids seem to be getting worse please call the office.   Hot Tubs:  Hot tubs Jacuzzis and saunas are not recommended while pregnant.  These increase your internal body temperature and should be avoided.  Intercourse:  Sexual intercourse is safe during pregnancy as long as you are comfortable, unless otherwise advised by your provider.  Spotting may occur after intercourse; report any bright red bleeding that is heavier than spotting.  Labor:  If you know that you are in labor, please go to the hospital.  If you are unsure, please call the office and let us help you decide what to do.  Lifting, straining, etc:  If your job requires heavy lifting or straining please check with your provider for any limitations.  Generally, you should not lift items heavier than that you can lift simply with your hands and arms (no back muscles)  Painting:  Paint fumes do not harm your pregnancy, but may make you ill and should be avoided if possible.  Latex or water based paints have less odor than oils.  Use adequate ventilation while painting.  Permanents & Hair Color:  Chemicals in hair dyes are not recommended as they cause increase hair dryness which can increase hair loss during pregnancy.  " Highlighting" and permanents are allowed.  Dye may be absorbed differently and permanents may not hold as well during pregnancy.  Sunbathing:  Use a sunscreen, as skin burns easily during pregnancy.  Drink plenty of fluids; avoid over heating.  Tanning Beds:  Because their possible side effects are still unknown, tanning beds are not recommended.  Ultrasound Scans:  Routine ultrasounds are performed  at approximately 20 weeks.  You will be able to see your baby's general anatomy an if you would like to know the gender this can usually be determined as well.  If it is questionable when you conceived you may also receive an ultrasound early in your pregnancy for dating purposes.  Otherwise ultrasound exams are not routinely performed unless there is a medical necessity.  Although you can request a scan we ask that you pay for it when  conducted because insurance does not cover " patient request" scans.  Work: If your pregnancy proceeds without complications you may work until your due date, unless your physician or employer advises otherwise.  Round Ligament Pain/Pelvic Discomfort:  Sharp, shooting pains not associated with bleeding are fairly common, usually occurring in the second trimester of pregnancy.  They tend to be worse when standing up or when you remain standing for long periods of time.  These are the result of pressure of certain pelvic ligaments called "round ligaments".  Rest, Tylenol and heat seem to be the most effective relief.  As the womb and fetus grow, they rise out of the pelvis and the discomfort improves.  Please notify the office if your pain seems different than that described.  It may represent a more serious condition.  Common Medications Safe in Pregnancy  Acne:      Constipation:  Benzoyl Peroxide     Colace  Clindamycin      Dulcolax Suppository  Topica Erythromycin     Fibercon  Salicylic Acid      Metamucil         Miralax AVOID:        Senakot   Accutane    Cough:  Retin-A       Cough Drops  Tetracycline      Phenergan w/ Codeine if Rx  Minocycline      Robitussin (Plain & DM)  Antibiotics:     Crabs/Lice:  Ceclor       RID  Cephalosporins    AVOID:  E-Mycins      Kwell  Keflex  Macrobid/Macrodantin   Diarrhea:  Penicillin      Kao-Pectate  Zithromax      Imodium AD         PUSH FLUIDS AVOID:       Cipro     Fever:  Tetracycline      Tylenol (Regular  or Extra  Minocycline       Strength)  Levaquin      Extra Strength-Do not          Exceed 8 tabs/24 hrs Caffeine:        <273m/day (equiv. To 1 cup of coffee or  approx. 3 12 oz sodas)         Gas: Cold/Hayfever:       Gas-X  Benadryl      Mylicon  Claritin       Phazyme  **Claritin-D        Chlor-Trimeton    Headaches:  Dimetapp      ASA-Free Excedrin  Drixoral-Non-Drowsy     Cold Compress  Mucinex (Guaifenasin)     Tylenol (Regular or Extra  Sudafed/Sudafed-12 Hour     Strength)  **Sudafed PE Pseudoephedrine   Tylenol Cold & Sinus     Vicks Vapor Rub  Zyrtec  **AVOID if Problems With Blood Pressure         Heartburn: Avoid lying down for at least 1 hour after meals  Aciphex      Maalox     Rash:  Milk of Magnesia     Benadryl    Mylanta       1% Hydrocortisone Cream  Pepcid  Pepcid Complete   Sleep Aids:  Prevacid      Ambien   Prilosec       Benadryl  Rolaids       Chamomile Tea  Tums (Limit 4/day)     Unisom  Tylenol PM         Warm milk-add vanilla or  Hemorrhoids:       Sugar for taste  Anusol/Anusol H.C.  (RX: Analapram 2.5%)  Sugar Substitutes:  Hydrocortisone OTC     Ok in moderation  Preparation H      Tucks        Vaseline lotion applied to tissue with wiping    Herpes:     Throat:  Acyclovir      Oragel  Famvir  Valtrex     Vaccines:         Flu Shot Leg Cramps:       *Gardasil  Benadryl      Hepatitis A         Hepatitis B Nasal Spray:       Pneumovax  Saline Nasal Spray     Polio Booster         Tetanus Nausea:       Tuberculosis test or PPD  Vitamin B6 25 mg TID   AVOID:    Dramamine      *Gardasil  Emetrol       Live Poliovirus  Ginger Root 250 mg QID    MMR (measles, mumps &  High Complex Carbs @ Bedtime    rebella)  Sea Bands-Accupressure    Varicella (Chickenpox)  Unisom 1/2 tab TID     *No known complications           If received before Pain:         Known pregnancy;   Darvocet       Resume series  after  Lortab        Delivery  Percocet    Yeast:   Tramadol      Femstat  Tylenol 3      Gyne-lotrimin  Ultram       Monistat  Vicodin           MISC:         All Sunscreens           Hair Coloring/highlights          Insect Repellant's          (Including DEET)         Mystic Tans

## 2022-03-22 NOTE — Addendum Note (Signed)
Addended by: Loran Senters D on: 03/22/2022 09:53 AM   Modules accepted: Orders

## 2022-03-28 ENCOUNTER — Ambulatory Visit
Admission: RE | Admit: 2022-03-28 | Discharge: 2022-03-28 | Disposition: A | Discharge status: Home / Self Care | Payer: Medicaid Other | Source: Ambulatory Visit | Attending: Obstetrics | Admitting: Obstetrics

## 2022-03-28 ENCOUNTER — Other Ambulatory Visit: Payer: Self-pay | Admitting: Obstetrics

## 2022-03-28 DIAGNOSIS — Z3480 Encounter for supervision of other normal pregnancy, unspecified trimester: Secondary | ICD-10-CM

## 2022-03-28 DIAGNOSIS — Z369 Encounter for antenatal screening, unspecified: Secondary | ICD-10-CM | POA: Diagnosis present

## 2022-03-28 DIAGNOSIS — Z3A09 9 weeks gestation of pregnancy: Secondary | ICD-10-CM | POA: Insufficient documentation

## 2022-03-28 DIAGNOSIS — Z3481 Encounter for supervision of other normal pregnancy, first trimester: Secondary | ICD-10-CM | POA: Insufficient documentation

## 2022-03-30 ENCOUNTER — Other Ambulatory Visit (HOSPITAL_COMMUNITY)
Admission: RE | Admit: 2022-03-30 | Discharge: 2022-03-30 | Disposition: A | Discharge status: Home / Self Care | Payer: Medicaid Other | Source: Ambulatory Visit | Attending: Obstetrics | Admitting: Obstetrics

## 2022-03-30 ENCOUNTER — Encounter: Payer: Self-pay | Admitting: Obstetrics

## 2022-03-30 ENCOUNTER — Other Ambulatory Visit: Payer: Medicaid Other

## 2022-03-30 DIAGNOSIS — Z369 Encounter for antenatal screening, unspecified: Secondary | ICD-10-CM | POA: Insufficient documentation

## 2022-03-30 DIAGNOSIS — Z3480 Encounter for supervision of other normal pregnancy, unspecified trimester: Secondary | ICD-10-CM | POA: Insufficient documentation

## 2022-03-31 LAB — URINALYSIS, ROUTINE W REFLEX MICROSCOPIC
Bilirubin, UA: NEGATIVE
Glucose, UA: NEGATIVE
Ketones, UA: NEGATIVE
Leukocytes,UA: NEGATIVE
Nitrite, UA: NEGATIVE
RBC, UA: NEGATIVE
Specific Gravity, UA: >=1.03 — AB (ref 1.005–1.030)
Urobilinogen, Ur: 0.2 mg/dL (ref 0.2–1.0)
pH, UA: 6.5 (ref 5.0–7.5)

## 2022-04-01 LAB — CBC/D/PLT+RPR+RH+ABO+RUBIGG...
Antibody Screen: NEGATIVE
Basophils Absolute: 0 10*3/uL (ref 0.0–0.2)
Basos: 0 %
EOS (ABSOLUTE): 0.2 10*3/uL (ref 0.0–0.4)
Eos: 2 %
HCV Ab: NONREACTIVE
HIV Screen 4th Generation wRfx: NONREACTIVE
Hematocrit: 36.5 % (ref 34.0–46.6)
Hemoglobin: 13 g/dL (ref 11.1–15.9)
Hepatitis B Surface Ag: NEGATIVE
Immature Grans (Abs): 0 10*3/uL (ref 0.0–0.1)
Immature Granulocytes: 0 %
Lymphocytes Absolute: 2.5 10*3/uL (ref 0.7–3.1)
Lymphs: 27 %
MCH: 30.2 pg (ref 26.6–33.0)
MCHC: 35.6 g/dL (ref 31.5–35.7)
MCV: 85 fL (ref 79–97)
Monocytes Absolute: 0.5 10*3/uL (ref 0.1–0.9)
Monocytes: 6 %
Neutrophils Absolute: 6 10*3/uL (ref 1.4–7.0)
Neutrophils: 65 %
Platelets: 344 10*3/uL (ref 150–450)
RBC: 4.31 x10E6/uL (ref 3.77–5.28)
RDW: 13.1 % (ref 11.7–15.4)
RPR Ser Ql: NONREACTIVE
Rh Factor: POSITIVE
Rubella Antibodies, IGG: 0.92 index — ABNORMAL LOW (ref >0.99)
Varicella zoster IgG: 175 index (ref >165)
WBC: 9.3 10*3/uL (ref 3.4–10.8)

## 2022-04-01 LAB — HCV INTERPRETATION

## 2022-04-02 LAB — CULTURE, OB URINE

## 2022-04-02 LAB — URINE CULTURE, OB REFLEX

## 2022-04-03 NOTE — L&D Delivery Note (Signed)
Delivery Note   Susan Kent is a 27 y.o. G3P1011 at [redacted]w[redacted]d Estimated Date of Delivery: 10/24/22  PRE-OPERATIVE DIAGNOSIS:  1) [redacted]w[redacted]d pregnancy.    POST-OPERATIVE DIAGNOSIS:  1) [redacted]w[redacted]d pregnancy s/p Vaginal, Spontaneous   Delivery Type: Vaginal, Spontaneous   Delivery Anesthesia: Epidural  Labor Complications: None    ESTIMATED BLOOD LOSS: 200 ml    FINDINGS:   1) female infant, Apgar scores of 8   at 1 minute and 9   at 5 minutes. Birthweight pending.  SPECIMENS:   PLACENTA:   Appearance: Intact   Removal: Spontaneous     Disposition: Per protocol  CORD BLOOD: Collected  DISPOSITION:  Infant left in stable condition in the delivery room, with L&D personnel and mother,  NARRATIVE SUMMARY: Labor course:  Susan Kent is a G3P1011 at [redacted]w[redacted]d who presented to Labor & Delivery for labor. Her initial cervical exam was 5.5/80/-2. Labor proceeded spontaneously, and she was found to be completely dilated at 0221. With excellent maternal pushing effort, she birthed a viable female infant, Susan Kent, at 14. There was a nuchal cord which was reduced after birth. The shoulders were birthed without difficulty. The infant was placed skin-to-skin with Susan Kent. The cord was doubly clamped and cut by the father when pulsations ceased. The placenta delivered spontaneously and was noted to be intact with a 3VC. A perineal and vaginal examination was performed. Episiotomy/Lacerations: 1st degree;Labial Lacerations were hemostatic and were not repaired. Susan Kent tolerated this well. Mother and baby were left in stable condition.   Susan Kent, Susan Kent 10/28/2022 3:38 AM

## 2022-04-04 LAB — URINE CYTOLOGY ANCILLARY ONLY
Chlamydia: NEGATIVE
Comment: NEGATIVE
Comment: NORMAL
Neisseria Gonorrhea: NEGATIVE

## 2022-04-04 LAB — MATERNIT 21 PLUS CORE, BLOOD
Fetal Fraction: 8
Result (T21): NEGATIVE
Trisomy 13 (Patau syndrome): NEGATIVE
Trisomy 18 (Edwards syndrome): NEGATIVE
Trisomy 21 (Down syndrome): NEGATIVE

## 2022-04-05 ENCOUNTER — Other Ambulatory Visit (HOSPITAL_COMMUNITY)
Admission: RE | Admit: 2022-04-05 | Discharge: 2022-04-05 | Disposition: A | Discharge status: Home / Self Care | Payer: Medicaid Other | Source: Ambulatory Visit | Attending: Obstetrics | Admitting: Obstetrics

## 2022-04-05 ENCOUNTER — Ambulatory Visit (INDEPENDENT_AMBULATORY_CARE_PROVIDER_SITE_OTHER): Payer: Medicaid Other | Admitting: Obstetrics

## 2022-04-05 VITALS — BP 109/68 | HR 76 | Wt 176.0 lb

## 2022-04-05 DIAGNOSIS — Z113 Encounter for screening for infections with a predominantly sexual mode of transmission: Secondary | ICD-10-CM | POA: Diagnosis not present

## 2022-04-05 DIAGNOSIS — Z3481 Encounter for supervision of other normal pregnancy, first trimester: Secondary | ICD-10-CM | POA: Diagnosis not present

## 2022-04-05 DIAGNOSIS — Z3A11 11 weeks gestation of pregnancy: Secondary | ICD-10-CM

## 2022-04-05 LAB — POCT URINALYSIS DIPSTICK OB
Bilirubin, UA: NEGATIVE
Blood, UA: NEGATIVE
Glucose, UA: NEGATIVE
Ketones, UA: NEGATIVE
Leukocytes, UA: NEGATIVE
Nitrite, UA: NEGATIVE
POC,PROTEIN,UA: NEGATIVE
Spec Grav, UA: 1.02 (ref 1.010–1.025)
Urobilinogen, UA: 0.2 E.U./dL — AB
pH, UA: 7 (ref 5.0–8.0)

## 2022-04-05 MED ORDER — ONDANSETRON HCL 4 MG PO TABS: 4.0000 mg | ORAL_TABLET | Freq: Three times a day (TID) | ORAL | 0 refills | Status: DC | PRN

## 2022-04-05 MED ORDER — BUTALBITAL-APAP-CAFFEINE 50-325-40 MG PO TABS: 1.0000 | ORAL_TABLET | Freq: Four times a day (QID) | ORAL | 0 refills | Status: AC | PRN

## 2022-04-05 NOTE — Progress Notes (Signed)
NEW OB HISTORY AND PHYSICAL  SUBJECTIVE:       Susan Kent is a 27 y.o. G81P1011 female, Patient's last menstrual period was 01/17/2022 (exact date)., Estimated Date of Delivery: 10/24/22, [redacted]w[redacted]d, presents today for establishment of Prenatal Care. She reports mild nausea and breast tenderness. She also notes that she has been having daily pain in her leg where she had a rod placed in her femur. She states that she has weekly migraines that responded well to Excedrin prior to pregnancy. Her migraines do not respond well to Tylenol alone and will lead to nausea and vomiting.  Social history Partner/Relationship: FOB involved and supportive Living situation: lives with boyfriend; feels safe Work: in cosmetology school Exercise: just at school Substance use: denies EtOH, tobacco, vape, and recreational    Gynecologic History Patient's last menstrual period was 01/17/2022 (exact date). Normal Contraception: none Last Pap: 07/14/19. Results were: normal  Obstetric History OB History  Gravida Para Term Preterm AB Living  3 1 1   1 1   SAB IAB Ectopic Multiple Live Births  1     0 1    # Outcome Date GA Lbr Len/2nd Weight Sex Delivery Anes PTL Lv  3 Current           2 SAB 10/16/20     SAB     1 Term 09/05/15 [redacted]w[redacted]d  7 lb 8 oz (3.402 kg) F Vag-Spont EPI  LIV    Past Medical History:  Diagnosis Date   History of anemia    Postpartum hemorrhage, delivered, current hospitalization 09/05/2015    Past Surgical History:  Procedure Laterality Date   CHOLECYSTECTOMY N/A 07/28/2016   Procedure: LAPAROSCOPIC CHOLECYSTECTOMY;  Surgeon: 07/30/2016, MD;  Location: [facility] ORS;  Service: General;  Laterality: N/A;   FEMUR FRACTURE SURGERY  2020    Current Outpatient Medications on File Prior to Visit  Medication Sig Dispense Refill   Prenatal Vit-Fe Fumarate-FA (MULTIVITAMIN-PRENATAL) 27-0.8 MG TABS tablet Take 1 tablet by mouth daily at 12 noon.     No current facility-administered  medications on file prior to visit.    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  Tobacco Use   Smoking status: Never   Smokeless tobacco: Never  Vaping Use   Vaping Use: Never used  Substance and Sexual Activity   Alcohol use: Not Currently    Comment: Last ETOH use end of 05/2021   Drug use: Never   Sexual activity: Yes    Partners: Male    Birth control/protection: None  Other Topics Concern   Not on file  Social History Narrative   Not on file   Social Determinants of Health   Financial Resource Strain: Low Risk  (03/22/2022)   Overall Financial Resource Strain (CARDIA)    Difficulty of Paying Living Expenses: Not hard at all  Food Insecurity: No Food Insecurity (03/22/2022)   Hunger Vital Sign    Worried About Running Out of Food in the Last Year: Never true    Ran Out of Food in the Last Year: Never true  Transportation Needs: No Transportation Needs (03/22/2022)   PRAPARE - 03/24/2022 (Medical): No  Lack of Transportation (Non-Medical): No  Physical Activity: Sufficiently Active (03/22/2022)   Exercise Vital Sign    Days of Exercise per Week: 7 days    Minutes of Exercise per Session: 50 min  Stress: No Stress Concern Present (03/22/2022)   [place]    Feeling of Stress : Not at all  Social Connections: Moderately Integrated (03/22/2022)   Social Connection and Isolation Panel [NHANES]    Frequency of Communication with Friends and Family: More than three times a week    Frequency of Social Gatherings with Friends and Family: Three times a week    Attends Religious Services: More than 4 times per year    Active  Member of Clubs or Organizations: No    Attends Archivist Meetings: Never    Marital Status: Living with partner  Intimate Partner Violence: Not At Risk (03/22/2022)   Humiliation, Afraid, Rape, and Kick questionnaire    Fear of Current or Ex-Partner: No    Emotionally Abused: No    Physically Abused: No    Sexually Abused: No    Family History  Problem Relation Age of Onset   Hypertension Mother    Migraines Mother    Cancer - Cervical Mother 3   Healthy Father    Healthy Sister    Healthy Sister    Diabetes Sister    Hypertension Sister    Healthy Brother    Multiple sclerosis Brother    Healthy Brother    Healthy Brother    Healthy Brother    Healthy Maternal Grandmother    Healthy Maternal Grandfather    Healthy Paternal Grandmother    Healthy Paternal Grandfather     The following portions of the patient's history were reviewed and updated as appropriate: allergies, current medications, past OB history, past medical history, past surgical history, past family history, past social history, and problem list.  History obtained from the patient General ROS: negative for - chills, fatigue, fever, or hot flashes Psychological ROS: negative for - anxiety or depression Ophthalmic ROS: negative for - blurry vision or decreased vision ENT ROS: negative for - headaches or sore throat Hematological and Lymphatic ROS: negative for - bleeding problems, bruising, or swollen lymph nodes Endocrine ROS: negative for - breast changes, hot flashes, mood swings, or palpitations Breast ROS: negative for breast lumps Respiratory ROS: no cough, shortness of breath, or wheezing Cardiovascular ROS: no chest pain or dyspnea on exertion Gastrointestinal ROS: no abdominal pain, change in bowel habits, or black or bloody stools Genito-Urinary ROS: no dysuria, trouble voiding, or hematuria Musculoskeletal ROS: negative Dermatological ROS: negative   OBJECTIVE: Initial Physical  Exam (New OB)  GENERAL APPEARANCE: alert, well appearing HEAD: normocephalic, atraumatic MOUTH: mucous membranes moist, pharynx normal without lesions THYROID: no thyromegaly or masses present BREASTS: no masses noted, no significant tenderness, no palpable axillary nodes, no skin changes LUNGS: clear to auscultation, no wheezes, rales or rhonchi, symmetric air entry HEART: regular rate and rhythm, no murmurs ABDOMEN: soft, nontender, nondistended, no abnormal masses, no epigastric pain, fundus soft, nontender 11 weeks size, and FHT present EXTREMITIES: no redness or tenderness in the calves or thighs, no edema SKIN: normal coloration and turgor, no rashes LYMPH NODES: no adenopathy palpable NEUROLOGIC: alert, oriented, normal speech, no focal findings or movement disorder noted  PELVIC EXAM EXTERNAL GENITALIA: normal appearing vulva with no masses, tenderness or lesions VAGINA: no abnormal discharge or lesions CERVIX: no lesions or cervical  motion tenderness. Pap collected. UTERUS: gravid and consistent with 11 weeks OB EXAM PELVIMETRY: appears adequate  ASSESSMENT: Normal pregnancy [redacted]w[redacted]d   PLAN: Routine prenatal care. We discussed an overview of prenatal care and when to call. Reviewed diet, exercise, and weight gain recommendations in pregnancy. Discussed benefits of breastfeeding and lactation resources at [facility]. I reviewed labs and answered all questions. Encouraged to make an appointment with orthopedics to f/u on leg pain. Reviewed comfort measures for migraine. Rx for Fioricet given to use sparingly. RTC in 4 weeks.  See orders  Lloyd Huger, CNM

## 2022-04-06 LAB — MONITOR DRUG PROFILE 14(MW)
Amphetamine Scrn, Ur: NEGATIVE ng/mL
BARBITURATE SCREEN URINE: NEGATIVE ng/mL
BENZODIAZEPINE SCREEN, URINE: NEGATIVE ng/mL
Buprenorphine, Urine: NEGATIVE ng/mL
CANNABINOIDS UR QL SCN: NEGATIVE ng/mL
Cocaine (Metab) Scrn, Ur: NEGATIVE ng/mL
Creatinine(Crt), U: 230.9 mg/dL (ref 20.0–300.0)
Fentanyl, Urine: NEGATIVE pg/mL
Meperidine Screen, Urine: NEGATIVE ng/mL
Methadone Screen, Urine: NEGATIVE ng/mL
OXYCODONE+OXYMORPHONE UR QL SCN: NEGATIVE ng/mL
Opiate Scrn, Ur: NEGATIVE ng/mL
Ph of Urine: 6.1 (ref 4.5–8.9)
Phencyclidine Qn, Ur: NEGATIVE ng/mL
Propoxyphene Scrn, Ur: NEGATIVE ng/mL
SPECIFIC GRAVITY: 1.026
Tramadol Screen, Urine: NEGATIVE ng/mL

## 2022-04-06 LAB — NICOTINE SCREEN, URINE: Cotinine Ql Scrn, Ur: NEGATIVE ng/mL

## 2022-04-09 LAB — CYTOLOGY - PAP

## 2022-04-11 ENCOUNTER — Encounter: Payer: Self-pay | Admitting: Obstetrics

## 2022-05-03 ENCOUNTER — Encounter: Payer: Self-pay | Admitting: Obstetrics

## 2022-05-03 ENCOUNTER — Ambulatory Visit (INDEPENDENT_AMBULATORY_CARE_PROVIDER_SITE_OTHER): Payer: Medicaid Other | Admitting: Obstetrics

## 2022-05-03 VITALS — BP 100/67 | HR 88 | Wt 176.0 lb

## 2022-05-03 DIAGNOSIS — R102 Pelvic and perineal pain: Secondary | ICD-10-CM

## 2022-05-03 DIAGNOSIS — Z3480 Encounter for supervision of other normal pregnancy, unspecified trimester: Secondary | ICD-10-CM

## 2022-05-03 DIAGNOSIS — Z3482 Encounter for supervision of other normal pregnancy, second trimester: Secondary | ICD-10-CM

## 2022-05-03 DIAGNOSIS — Z3A15 15 weeks gestation of pregnancy: Secondary | ICD-10-CM

## 2022-05-03 LAB — POCT URINALYSIS DIPSTICK OB
Bilirubin, UA: NEGATIVE
Glucose, UA: NEGATIVE
Ketones, UA: NEGATIVE
Leukocytes, UA: NEGATIVE
Spec Grav, UA: 1.02 (ref 1.010–1.025)
Urobilinogen, UA: 0.2 E.U./dL
pH, UA: 7 (ref 5.0–8.0)

## 2022-05-03 NOTE — Addendum Note (Signed)
Addended by: Landis Gandy on: 05/03/2022 10:34 AM   Modules accepted: Orders

## 2022-05-03 NOTE — Progress Notes (Signed)
ROB at [redacted]w[redacted]d. Starting to feel some flutters! Reviewed Pap results and f/u. Susan Kent has started having morning sickness the past 3 days. Her nausea resolves after she vomits. She is staying active with her daughter and at school. She reports feeling some suprapubic pressure when sitting. Denies s/s of UTI. Anatomy scan and ROB in 4 weeks.  Lurlean Horns, CNM

## 2022-05-06 LAB — URINE CULTURE, OB REFLEX

## 2022-05-06 LAB — CULTURE, OB URINE

## 2022-05-15 ENCOUNTER — Telehealth: Payer: Self-pay | Admitting: Obstetrics

## 2022-05-15 NOTE — Telephone Encounter (Signed)
Left message for patient to call office back to r/s appt with Missy on 05/30/22

## 2022-05-24 ENCOUNTER — Emergency Department
Admission: EM | Admit: 2022-05-24 | Discharge: 2022-05-24 | Disposition: A | Discharge status: Home / Self Care | Payer: Medicaid Other | Attending: Emergency Medicine | Admitting: Emergency Medicine

## 2022-05-24 ENCOUNTER — Encounter: Payer: Self-pay | Admitting: Emergency Medicine

## 2022-05-24 ENCOUNTER — Other Ambulatory Visit: Payer: Self-pay

## 2022-05-24 DIAGNOSIS — O99612 Diseases of the digestive system complicating pregnancy, second trimester: Secondary | ICD-10-CM | POA: Diagnosis not present

## 2022-05-24 DIAGNOSIS — K529 Noninfective gastroenteritis and colitis, unspecified: Secondary | ICD-10-CM | POA: Diagnosis not present

## 2022-05-24 DIAGNOSIS — Z3A18 18 weeks gestation of pregnancy: Secondary | ICD-10-CM | POA: Insufficient documentation

## 2022-05-24 DIAGNOSIS — R1110 Vomiting, unspecified: Secondary | ICD-10-CM

## 2022-05-24 DIAGNOSIS — O219 Vomiting of pregnancy, unspecified: Secondary | ICD-10-CM | POA: Diagnosis present

## 2022-05-24 LAB — CBC
HCT: 38.2 % (ref 36.0–46.0)
Hemoglobin: 13.3 g/dL (ref 12.0–15.0)
MCH: 29.7 pg (ref 26.0–34.0)
MCHC: 34.8 g/dL (ref 30.0–36.0)
MCV: 85.3 fL (ref 80.0–100.0)
Platelets: 349 10*3/uL (ref 150–400)
RBC: 4.48 MIL/uL (ref 3.87–5.11)
RDW: 13.3 % (ref 11.5–15.5)
WBC: 15.5 10*3/uL — ABNORMAL HIGH (ref 4.0–10.5)
nRBC: 0 % (ref 0.0–0.2)

## 2022-05-24 LAB — COMPREHENSIVE METABOLIC PANEL
ALT: 17 U/L (ref 0–44)
AST: 22 U/L (ref 15–41)
Albumin: 3.6 g/dL (ref 3.5–5.0)
Alkaline Phosphatase: 86 U/L (ref 38–126)
Anion gap: 8 (ref 5–15)
BUN: 8 mg/dL (ref 6–20)
CO2: 20 mmol/L — ABNORMAL LOW (ref 22–32)
Calcium: 8.9 mg/dL (ref 8.9–10.3)
Chloride: 105 mmol/L (ref 98–111)
Creatinine, Ser: 0.42 mg/dL — ABNORMAL LOW (ref 0.44–1.00)
GFR, Estimated: >60 mL/min (ref >60)
Glucose, Bld: 93 mg/dL (ref 70–99)
Potassium: 3.3 mmol/L — ABNORMAL LOW (ref 3.5–5.1)
Sodium: 133 mmol/L — ABNORMAL LOW (ref 135–145)
Total Bilirubin: 0.7 mg/dL (ref 0.3–1.2)
Total Protein: 7.4 g/dL (ref 6.5–8.1)

## 2022-05-24 LAB — URINALYSIS, ROUTINE W REFLEX MICROSCOPIC
Bilirubin Urine: NEGATIVE
Glucose, UA: NEGATIVE mg/dL
Hgb urine dipstick: NEGATIVE
Ketones, ur: 20 mg/dL — AB
Leukocytes,Ua: NEGATIVE
Nitrite: NEGATIVE
Protein, ur: 100 mg/dL — AB
Specific Gravity, Urine: 1.027 (ref 1.005–1.030)
pH: 5 (ref 5.0–8.0)

## 2022-05-24 LAB — LIPASE, BLOOD: Lipase: 27 U/L (ref 11–51)

## 2022-05-24 MED ORDER — LACTATED RINGERS IV BOLUS
1000.0000 mL | Freq: Once | INTRAVENOUS | Status: AC
  Administered 2022-05-24: 1000 mL via INTRAVENOUS

## 2022-05-24 MED ORDER — ONDANSETRON 4 MG PO TBDP: 4.0000 mg | ORAL_TABLET | Freq: Three times a day (TID) | ORAL | 0 refills | Status: DC | PRN

## 2022-05-24 MED ORDER — ONDANSETRON HCL 4 MG/2ML IJ SOLN
4.0000 mg | Freq: Once | INTRAMUSCULAR | Status: AC
  Administered 2022-05-24: 4 mg via INTRAVENOUS
  Filled 2022-05-24: qty 2

## 2022-05-24 MED ORDER — POTASSIUM CHLORIDE CRYS ER 20 MEQ PO TBCR
20.0000 meq | EXTENDED_RELEASE_TABLET | Freq: Once | ORAL | Status: AC
  Administered 2022-05-24: 20 meq via ORAL
  Filled 2022-05-24: qty 1

## 2022-05-24 NOTE — ED Provider Notes (Signed)
[facility] Provider Note    Event Date/Time   First MD Initiated Contact with Patient 05/24/22 1507     (approximate)   History   Chief Complaint Emesis   HPI  Susan Kent is a 27 y.o. female G3 P1-0-1-1 at approximately 2 weeks of pregnancy who presents to the ED complaining of nausea and vomiting.  Patient reports that she initially began feeling nauseous between 2 and 3 AM this morning, when she also had multiple episodes of vomiting.  She then reports an episode of diarrhea at 7 AM and 10 AM this morning.  She has continued to vomit frequently over the course of the day, states she has been unable to keep down either liquids or solids.  She does report some crampy pain in her lower abdomen but she denies any fevers, dysuria, hematuria, or flank pain.  She follows with [facility] for her obstetric care.     Physical Exam   Triage Vital Signs: ED Triage Vitals  Enc Vitals Group     BP 05/24/22 1415 117/77     Pulse Rate 05/24/22 1415 (!) 112     Resp 05/24/22 1415 17     Temp 05/24/22 1415 (!) 97.5 F (36.4 C)     Temp Source 05/24/22 1415 Oral     SpO2 05/24/22 1415 100 %     Weight 05/24/22 1416 175 lb (79.4 kg)     Height 05/24/22 1416 5' (1.524 m)     Head Circumference --      Peak Flow --      Pain Score 05/24/22 1421 10     Pain Loc --      Pain Edu? --      Excl. in [place]? --     Most recent vital signs: Vitals:   05/24/22 1415  BP: 117/77  Pulse: (!) 112  Resp: 17  Temp: (!) 97.5 F (36.4 C)  SpO2: 100%    Constitutional: Alert and oriented. Eyes: Conjunctivae are normal. Head: Atraumatic. Nose: No congestion/rhinnorhea. Mouth/Throat: Mucous membranes are moist.  Cardiovascular: Normal rate, regular rhythm. Grossly normal heart sounds.  2+ radial pulses bilaterally. Respiratory: Normal respiratory effort.  No retractions. Lungs CTAB. Gastrointestinal: Gravid abdomen, soft and nontender. No  distention. Musculoskeletal: No lower extremity tenderness nor edema.  Neurologic:  Normal speech and language. No gross focal neurologic deficits are appreciated.    ED Results / Procedures / Treatments   Labs (all labs ordered are listed, but only abnormal results are displayed) Labs Reviewed  COMPREHENSIVE METABOLIC PANEL - Abnormal; Notable for the following components:      Result Value   Sodium 133 (*)    Potassium 3.3 (*)    CO2 20 (*)    Creatinine, Ser 0.42 (*)    All other components within normal limits  CBC - Abnormal; Notable for the following components:   WBC 15.5 (*)    All other components within normal limits  URINALYSIS, ROUTINE W REFLEX MICROSCOPIC - Abnormal; Notable for the following components:   Color, Urine AMBER (*)    APPearance CLOUDY (*)    Ketones, ur 20 (*)    Protein, ur 100 (*)    Bacteria, UA MANY (*)    All other components within normal limits  LIPASE, BLOOD   RADIOLOGY Bedside obstetrical ultrasound reviewed and interpreted by me with appropriate fetal heart movement and fetal heart rate of 154 bpm.  PROCEDURES:  Critical Care performed: No  Ultrasound ED OB Pelvic  Date/Time: 05/24/2022 4:05 PM  Performed by: Blake Divine, MD Authorized by: Blake Divine, MD   Procedure details:    Indications: pregnant with abdominal pain     Assess:  Intrauterine pregnancy   Technique:  Transabdominal obstetric (HCG+) exam   Images: not archived    Uterine findings:    Intrauterine pregnancy: identified     Single gestation: identified     Fetal heart rate: identified         MEDICATIONS ORDERED IN ED: Medications  potassium chloride SA (KLOR-CON M) CR tablet 20 mEq (has no administration in time range)  lactated ringers bolus 1,000 mL (1,000 mLs Intravenous New Bag/Given 05/24/22 1558)  ondansetron (ZOFRAN) injection 4 mg (4 mg Intravenous Given 05/24/22 1558)     IMPRESSION / MDM / [place] / ED COURSE  I reviewed  the triage vital signs and the nursing notes.                              27 y.o. female G3 P1-0-1-1 at approximately 18 weeks of pregnancy who presents to the ED complaining of persistent nausea, vomiting, and diarrhea with crampy lower abdominal pain since 3:00 this morning.  Patient's presentation is most consistent with acute presentation with potential threat to life or bodily function.  Differential diagnosis includes, but is not limited to, gastroenteritis, dehydration, AKI, electrolyte abnormality, appendicitis, UTI, kidney stone.  Patient well-appearing and in no acute distress, vital signs remarkable for tachycardia but otherwise reassuring.  Abdominal exam is reassuring with no focal tenderness, patient denies any vaginal bleeding or discharge.  Bedside obstetric ultrasound shows fetal movement with appropriate fetal heart rate.  Symptoms seem most consistent with a viral gastroenteritis, labs remarkable for mild hypokalemia but otherwise reassuring.  No significant anemia or AKI noted, mild leukocytosis noted and we will screen urinalysis but otherwise no concern for intra-abdominal infection.  We will treat symptomatically with IV Zofran and hydrate with IV fluids.  Urinalysis appears contaminated, with no urinary symptoms I will hold off on antibiotic treatment.  Patient ports feeling much better following IV fluids and Zofran, now tolerating water without difficulty.  She is appropriate for discharge home, will prescribe oral dissolving Zofran as she was previously prescribed tablets.  She was counseled to follow-up with OB/GYN and to return to the ED for new or worsening symptoms, patient present plan.      FINAL CLINICAL IMPRESSION(S) / ED DIAGNOSES   Final diagnoses:  Gastroenteritis  Vomiting and diarrhea     Rx / DC Orders   ED Discharge Orders          Ordered    ondansetron (ZOFRAN-ODT) 4 MG disintegrating tablet  Every 8 hours PRN        05/24/22 1737              Note:  This document was prepared using Dragon voice recognition software and may include unintentional dictation errors.   Blake Divine, MD 05/24/22 [phone]

## 2022-05-24 NOTE — ED Notes (Signed)
Pt has about 150 mL of IVF left then will d/c.

## 2022-05-24 NOTE — ED Notes (Signed)
Pt wishes to get whole 1L fluids before d/c.

## 2022-05-24 NOTE — ED Triage Notes (Signed)
Pt reports constant n/v today, diarrhea starting around 7 am. Pt attempted taking zofran but unable to keep it down. Only peed once today. Pt [redacted] weeks pregnant.

## 2022-05-24 NOTE — ED Notes (Signed)
Pt to ED with husband for vomiting since this AM, cannot hold anything down. Denies vaginal bleeding. Some abdominal cramping. FHTs auscultated in triage.

## 2022-05-29 ENCOUNTER — Ambulatory Visit
Admission: RE | Admit: 2022-05-29 | Discharge: 2022-05-29 | Disposition: A | Discharge status: Home / Self Care | Payer: Medicaid Other | Source: Ambulatory Visit | Attending: Obstetrics | Admitting: Obstetrics

## 2022-05-29 DIAGNOSIS — Z3A18 18 weeks gestation of pregnancy: Secondary | ICD-10-CM | POA: Diagnosis not present

## 2022-05-29 DIAGNOSIS — Z3689 Encounter for other specified antenatal screening: Secondary | ICD-10-CM | POA: Diagnosis not present

## 2022-05-29 DIAGNOSIS — Z3480 Encounter for supervision of other normal pregnancy, unspecified trimester: Secondary | ICD-10-CM | POA: Diagnosis present

## 2022-05-30 ENCOUNTER — Ambulatory Visit (INDEPENDENT_AMBULATORY_CARE_PROVIDER_SITE_OTHER): Payer: Medicaid Other | Admitting: Obstetrics

## 2022-05-30 ENCOUNTER — Encounter: Payer: Medicaid Other | Admitting: Obstetrics

## 2022-05-30 VITALS — BP 106/62 | HR 80 | Wt 181.1 lb

## 2022-05-30 DIAGNOSIS — Z3A19 19 weeks gestation of pregnancy: Secondary | ICD-10-CM

## 2022-05-30 DIAGNOSIS — Z3482 Encounter for supervision of other normal pregnancy, second trimester: Secondary | ICD-10-CM

## 2022-05-30 DIAGNOSIS — Z3480 Encounter for supervision of other normal pregnancy, unspecified trimester: Secondary | ICD-10-CM

## 2022-05-30 LAB — POCT URINALYSIS DIPSTICK
Bilirubin, UA: NEGATIVE
Blood, UA: NEGATIVE
Glucose, UA: NEGATIVE
Ketones, UA: NEGATIVE
Leukocytes, UA: NEGATIVE
Nitrite, UA: NEGATIVE
Protein, UA: NEGATIVE
Spec Grav, UA: 1.015 (ref 1.010–1.025)
Urobilinogen, UA: 1 E.U./dL
pH, UA: 6.5 (ref 5.0–8.0)

## 2022-05-30 NOTE — Progress Notes (Signed)
Routine Prenatal Care Visit  Subjective  Susan Kent is a 27 y.o. G3P1011 at 23w0dbeing seen today for ongoing prenatal care.  She is currently monitored for the following issues for this low-risk pregnancy and has Supervision of other normal pregnancy, antepartum; Symptomatic cholelithiasis; Cholelithiases; PCOS (polycystic ovarian syndrome); and Obesity BMI=32.3 on their problem list.  ----------------------------------------------------------------------------------- Patient reports no complaints.  Feeling flutters. Had her anatomy yesterday- no report in chart yet. Naming her daughter BLeotis Pain Contractions: Not present. Vag. Bleeding: None.  Movement: Present. Leaking Fluid denies.  ----------------------------------------------------------------------------------- The following portions of the patient's history were reviewed and updated as appropriate: allergies, current medications, past family history, past medical history, past social history, past surgical history and problem list. Problem list updated.  Objective  Blood pressure 106/62, pulse 80, weight 181 lb 1.6 oz (82.1 kg), last menstrual period 01/17/2022. Pregravid weight 170 lb (77.1 kg) Total Weight Gain 11 lb 1.6 oz (5.035 kg) Urinalysis: Urine Protein    Urine Glucose    Fetal Status:     Movement: Present     General:  Alert, oriented and cooperative. Patient is in no acute distress.  Skin: Skin is warm and dry. No rash noted.   Cardiovascular: Normal heart rate noted  Respiratory: Normal respiratory effort, no problems with respiration noted  Abdomen: Soft, gravid, appropriate for gestational age. Pain/Pressure: Absent     Pelvic:  Cervical exam deferred        Extremities: Normal range of motion.     Mental Status: Normal mood and affect. Normal behavior. Normal judgment and thought content.   Assessment   27y.o. G3P1011 at 151w0dy  10/24/2022, by Last Menstrual Period presenting for routine prenatal  visit  Plan   third Problems (from 03/22/22 to present)     No problems associated with this episode.        Preterm labor symptoms and general obstetric precautions including but not limited to vaginal bleeding, contractions, leaking of fluid and fetal movement were reviewed in detail with the patient. Please refer to After Visit Summary for other counseling recommendations.  I have ordered a f/u scan.  Return in about 4 weeks (around 06/27/2022) for return OB.  MaImagene RichesCNM  25/27/2024 10:36 AM

## 2022-05-31 ENCOUNTER — Encounter: Payer: Medicaid Other | Admitting: Advanced Practice Midwife

## 2022-05-31 ENCOUNTER — Encounter: Payer: Self-pay | Admitting: Obstetrics

## 2022-05-31 NOTE — Telephone Encounter (Signed)
The patient was seen 2/27 with MMF

## 2022-06-21 ENCOUNTER — Ambulatory Visit
Admission: RE | Admit: 2022-06-21 | Discharge: 2022-06-21 | Disposition: A | Discharge status: Home / Self Care | Payer: Medicaid Other | Source: Ambulatory Visit | Attending: Obstetrics | Admitting: Obstetrics

## 2022-06-21 DIAGNOSIS — Z3480 Encounter for supervision of other normal pregnancy, unspecified trimester: Secondary | ICD-10-CM

## 2022-06-27 ENCOUNTER — Encounter: Payer: Self-pay | Admitting: Advanced Practice Midwife

## 2022-06-27 ENCOUNTER — Ambulatory Visit (INDEPENDENT_AMBULATORY_CARE_PROVIDER_SITE_OTHER): Payer: Medicaid Other | Admitting: Advanced Practice Midwife

## 2022-06-27 VITALS — BP 120/80 | Wt 184.0 lb

## 2022-06-27 DIAGNOSIS — Z3A23 23 weeks gestation of pregnancy: Secondary | ICD-10-CM

## 2022-06-27 DIAGNOSIS — Z131 Encounter for screening for diabetes mellitus: Secondary | ICD-10-CM

## 2022-06-27 DIAGNOSIS — O99210 Obesity complicating pregnancy, unspecified trimester: Secondary | ICD-10-CM | POA: Insufficient documentation

## 2022-06-27 DIAGNOSIS — Z3480 Encounter for supervision of other normal pregnancy, unspecified trimester: Secondary | ICD-10-CM

## 2022-06-27 DIAGNOSIS — Z113 Encounter for screening for infections with a predominantly sexual mode of transmission: Secondary | ICD-10-CM

## 2022-06-27 DIAGNOSIS — Z369 Encounter for antenatal screening, unspecified: Secondary | ICD-10-CM

## 2022-06-27 DIAGNOSIS — Z3482 Encounter for supervision of other normal pregnancy, second trimester: Secondary | ICD-10-CM

## 2022-06-27 DIAGNOSIS — Z130 Encounter for screening for diseases of the blood and blood-forming organs and certain disorders involving the immune mechanism: Secondary | ICD-10-CM

## 2022-06-27 NOTE — Progress Notes (Signed)
Routine Prenatal Care Visit  Subjective  Susan Kent is a 27 y.o. G3P1011 at [redacted]w[redacted]d being seen today for ongoing prenatal care.  She is currently monitored for the following issues for this low-risk pregnancy and has Supervision of other normal pregnancy, antepartum; PCOS (polycystic ovarian syndrome); and Obesity affecting pregnancy on their problem list.  ----------------------------------------------------------------------------------- Patient reports no complaints.  She is generally feeling well. Reviewed 28 wk labs at next visit. Contractions: Not present. Vag. Bleeding: None.  Movement: Present. Leaking Fluid denies.  ----------------------------------------------------------------------------------- The following portions of the patient's history were reviewed and updated as appropriate: allergies, current medications, past family history, past medical history, past social history, past surgical history and problem list. Problem list updated.  Objective  Blood pressure 120/80, weight 184 lb (83.5 kg), last menstrual period 01/17/2022. Pregravid weight 170 lb (77.1 kg) Total Weight Gain 14 lb (6.35 kg) Urinalysis: Urine Protein    Urine Glucose    Fetal Status: Fetal Heart Rate (bpm): 159 Fundal Height: 23 cm Movement: Present     General:  Alert, oriented and cooperative. Patient is in no acute distress.  Skin: Skin is warm and dry. No rash noted.   Cardiovascular: Normal heart rate noted  Respiratory: Normal respiratory effort, no problems with respiration noted  Abdomen: Soft, gravid, appropriate for gestational age. Pain/Pressure: Present     Pelvic:  Cervical exam deferred        Extremities: Normal range of motion.  Edema: None  Mental Status: Normal mood and affect. Normal behavior. Normal judgment and thought content.   Assessment   27 y.o. G3P1011 at [redacted]w[redacted]d by  10/24/2022, by Last Menstrual Period presenting for routine prenatal visit  Plan   third Problems (from  03/22/22 to present)    Clinical Staff Provider  Office Location   Ob/Gyn Dating  10/24/2022, by Last Menstrual Period  Language  English Anatomy US  Normal anatomy  Flu Vaccine  offer Genetic Screen  NIPS: negative/female  TDaP vaccine   offer Hgb A1C or  GTT Early : Third trimester :   Covid No boosters   LAB RESULTS   Rhogam  O/Positive/-- (12/28 0913)  Blood Type O/Positive/-- (12/28 0913)   Feeding Plan yes Antibody Negative (12/28 0913)  Contraception mirena Rubella 0.92 (12/28 0913)  Circumcision no RPR Non Reactive (12/28 0913)   Pediatrician  [facility] HBsAg Negative (12/28 0913)   Support Person Edison Nasuti HIV Non Reactive (12/28 0913)  Prenatal Classes no Varicella     GBS  (For PCN allergy, check sensitivities)   BTL Consent  Hep C Non Reactive (12/28 0913)   VBAC Consent  Pap Diagnosis  Date Value Ref Range Status  04/05/2022 - Low grade squamous intraepithelial lesion (LSIL) (A)  Final      Hgb Electro      CF      SMA          Preterm labor symptoms and general obstetric precautions including but not limited to vaginal bleeding, contractions, leaking of fluid and fetal movement were reviewed in detail with the patient. Please refer to After Visit Summary for other counseling recommendations.   Return in about 4 weeks (around 07/25/2022) for 28 wk labs and rob.  Rod Can, CNM 06/27/2022 9:26 AM

## 2022-07-25 ENCOUNTER — Other Ambulatory Visit: Payer: Medicaid Other

## 2022-07-25 ENCOUNTER — Ambulatory Visit (INDEPENDENT_AMBULATORY_CARE_PROVIDER_SITE_OTHER): Payer: Medicaid Other | Admitting: Obstetrics

## 2022-07-25 ENCOUNTER — Encounter: Payer: Self-pay | Admitting: Obstetrics

## 2022-07-25 VITALS — BP 99/65 | HR 89 | Wt 193.0 lb

## 2022-07-25 DIAGNOSIS — Z369 Encounter for antenatal screening, unspecified: Secondary | ICD-10-CM

## 2022-07-25 DIAGNOSIS — Z23 Encounter for immunization: Secondary | ICD-10-CM | POA: Diagnosis not present

## 2022-07-25 DIAGNOSIS — Z130 Encounter for screening for diseases of the blood and blood-forming organs and certain disorders involving the immune mechanism: Secondary | ICD-10-CM

## 2022-07-25 DIAGNOSIS — Z3480 Encounter for supervision of other normal pregnancy, unspecified trimester: Secondary | ICD-10-CM

## 2022-07-25 DIAGNOSIS — Z3482 Encounter for supervision of other normal pregnancy, second trimester: Secondary | ICD-10-CM

## 2022-07-25 DIAGNOSIS — Z113 Encounter for screening for infections with a predominantly sexual mode of transmission: Secondary | ICD-10-CM

## 2022-07-25 DIAGNOSIS — Z131 Encounter for screening for diabetes mellitus: Secondary | ICD-10-CM

## 2022-07-25 DIAGNOSIS — Z3A27 27 weeks gestation of pregnancy: Secondary | ICD-10-CM

## 2022-07-25 LAB — POCT URINALYSIS DIPSTICK OB
Bilirubin, UA: NEGATIVE
Glucose, UA: NEGATIVE
Ketones, UA: NEGATIVE
Leukocytes, UA: NEGATIVE
Nitrite, UA: NEGATIVE
POC,PROTEIN,UA: NEGATIVE
Spec Grav, UA: 1.02 (ref 1.010–1.025)
Urobilinogen, UA: 0.2 E.U./dL
pH, UA: 7.5 (ref 5.0–8.0)

## 2022-07-25 NOTE — Progress Notes (Signed)
Susan Kent at [redacted]w[redacted]d. Active baby. Denies ctx, LOF, and vaginal bleeding. Susan Kent is feeling well overall. She is only having migraines about once a month now. She is staying active. Discussed making a birth plan today. She will likely want an epidural. She plans to breastfeed this time and would like extra support. Encouraged BF class and nursing parent support group. Undecided about contraception. Considering IUD, Depo, or BTL. Reviewed s/s of PTL. RTC in 2 weeks.  Glenetta Borg, CNM

## 2022-07-26 ENCOUNTER — Encounter: Payer: Self-pay | Admitting: Obstetrics

## 2022-07-26 LAB — 28 WEEK RH+PANEL
Basophils Absolute: 0 10*3/uL (ref 0.0–0.2)
Basos: 0 %
EOS (ABSOLUTE): 0.2 10*3/uL (ref 0.0–0.4)
Eos: 2 %
Gestational Diabetes Screen: 121 mg/dL (ref 70–139)
HIV Screen 4th Generation wRfx: NONREACTIVE
Hematocrit: 32.3 % — ABNORMAL LOW (ref 34.0–46.6)
Hemoglobin: 11 g/dL — ABNORMAL LOW (ref 11.1–15.9)
Immature Grans (Abs): 0.1 10*3/uL (ref 0.0–0.1)
Immature Granulocytes: 1 %
Lymphocytes Absolute: 2.5 10*3/uL (ref 0.7–3.1)
Lymphs: 25 %
MCH: 29.3 pg (ref 26.6–33.0)
MCHC: 34.1 g/dL (ref 31.5–35.7)
MCV: 86 fL (ref 79–97)
Monocytes Absolute: 0.5 10*3/uL (ref 0.1–0.9)
Monocytes: 5 %
Neutrophils Absolute: 6.9 10*3/uL (ref 1.4–7.0)
Neutrophils: 67 %
Platelets: 347 10*3/uL (ref 150–450)
RBC: 3.76 x10E6/uL — ABNORMAL LOW (ref 3.77–5.28)
RDW: 13 % (ref 11.7–15.4)
RPR Ser Ql: NONREACTIVE
WBC: 10.1 10*3/uL (ref 3.4–10.8)

## 2022-08-08 ENCOUNTER — Encounter: Payer: Self-pay | Admitting: Certified Nurse Midwife

## 2022-08-08 ENCOUNTER — Encounter: Payer: Self-pay | Admitting: Licensed Practical Nurse

## 2022-08-08 ENCOUNTER — Ambulatory Visit (INDEPENDENT_AMBULATORY_CARE_PROVIDER_SITE_OTHER): Payer: Medicaid Other | Admitting: Licensed Practical Nurse

## 2022-08-08 VITALS — BP 109/68 | HR 90 | Wt 193.7 lb

## 2022-08-08 DIAGNOSIS — Z3480 Encounter for supervision of other normal pregnancy, unspecified trimester: Secondary | ICD-10-CM

## 2022-08-08 DIAGNOSIS — Z3A29 29 weeks gestation of pregnancy: Secondary | ICD-10-CM

## 2022-08-08 DIAGNOSIS — Z3483 Encounter for supervision of other normal pregnancy, third trimester: Secondary | ICD-10-CM

## 2022-08-08 LAB — POCT URINALYSIS DIPSTICK
Bilirubin, UA: NEGATIVE
Blood, UA: NEGATIVE
Glucose, UA: NEGATIVE
Ketones, UA: NEGATIVE
Nitrite, UA: NEGATIVE
Protein, UA: NEGATIVE
Spec Grav, UA: 1.02 (ref 1.010–1.025)
Urobilinogen, UA: 0.2 E.U./dL
pH, UA: 7 (ref 5.0–8.0)

## 2022-08-08 NOTE — Progress Notes (Signed)
Subjective:    Susan Kent is a 27 y.o. G3P1011 [redacted]w[redacted]d being seen today for her obstetrical visit.  Patient reports no complaints. Fetal movement: normal. Interested in breastfeeding classes, aware of lactation support available in hospital after birth.  Objective:    BP 109/68   Pulse 90   Wt 193 lb 11.2 oz (87.9 kg)   LMP 01/17/2022 (Exact Date)   BMI 37.83 kg/m   Physical Exam Vitals and nursing note reviewed.  Constitutional:      Appearance: Normal appearance.  Cardiovascular:     Rate and Rhythm: Normal rate.  Pulmonary:     Effort: Pulmonary effort is normal.  Skin:    General: Skin is warm and dry.  Neurological:     General: No focal deficit present.     Mental Status: She is alert and oriented to person, place, and time.  Psychiatric:        Mood and Affect: Mood normal.        Behavior: Behavior normal.      FHT: Fetal Heart Rate (bpm): 135  Uterine Size: Fundal Height: 30 cm     Assessment:    Pregnancy:  G3P1011 at [redacted]w[redacted]d.     Plan:    Patient Active Problem List   Diagnosis Date Noted   Obesity affecting pregnancy 06/27/2022   PCOS (polycystic ovarian syndrome) 08/11/2019   Supervision of other normal pregnancy, antepartum 04/09/2015    Infant feeding: plans to breastfeed. Follow up in 2 Weeks.

## 2022-08-08 NOTE — Patient Instructions (Signed)

## 2022-08-10 LAB — URINE CULTURE

## 2022-08-22 ENCOUNTER — Encounter: Payer: Self-pay | Admitting: Advanced Practice Midwife

## 2022-08-22 ENCOUNTER — Ambulatory Visit (INDEPENDENT_AMBULATORY_CARE_PROVIDER_SITE_OTHER): Payer: Medicaid Other | Admitting: Advanced Practice Midwife

## 2022-08-22 VITALS — BP 100/60 | Wt 191.0 lb

## 2022-08-22 DIAGNOSIS — R87612 Low grade squamous intraepithelial lesion on cytologic smear of cervix (LGSIL): Secondary | ICD-10-CM | POA: Insufficient documentation

## 2022-08-22 DIAGNOSIS — Z3A31 31 weeks gestation of pregnancy: Secondary | ICD-10-CM

## 2022-08-22 DIAGNOSIS — Z3483 Encounter for supervision of other normal pregnancy, third trimester: Secondary | ICD-10-CM

## 2022-08-22 LAB — POCT URINALYSIS DIPSTICK OB
Bilirubin, UA: NEGATIVE
Blood, UA: NEGATIVE
Glucose, UA: NEGATIVE
Ketones, UA: NEGATIVE
Leukocytes, UA: NEGATIVE
Nitrite, UA: NEGATIVE
POC,PROTEIN,UA: NEGATIVE
Spec Grav, UA: 1.01 (ref 1.010–1.025)
Urobilinogen, UA: 1 E.U./dL
pH, UA: 6.5 (ref 5.0–8.0)

## 2022-08-22 NOTE — Patient Instructions (Signed)

## 2022-08-22 NOTE — Progress Notes (Signed)
Routine Prenatal Care Visit  Subjective  Susan Kent is a 27 y.o. G3P1011 at [redacted]w[redacted]d being seen today for ongoing prenatal care.  She is currently monitored for the following issues for this low-risk pregnancy and has Supervision of other normal pregnancy, antepartum; Obesity affecting pregnancy; and LGSIL on Pap smear of cervix on their problem list.  ----------------------------------------------------------------------------------- Patient reports  she is doing well and relatively comfortable. She requested removal of PCOS diagnosis (from 2017). She does not remember having irregular periods or difficulty conceiving or work up for the same .   Contractions: Not present. Vag. Bleeding: None.  Movement: Present. Leaking Fluid denies.  ----------------------------------------------------------------------------------- The following portions of the patient's history were reviewed and updated as appropriate: allergies, current medications, past family history, past medical history, past social history, past surgical history and problem list. Problem list updated.  Objective  Blood pressure 100/60, weight 191 lb (86.6 kg), last menstrual period 01/17/2022. Pregravid weight 170 lb (77.1 kg) Total Weight Gain 21 lb (9.526 kg) Urinalysis: Urine Protein    Urine Glucose    Fetal Status: Fetal Heart Rate (bpm): 151 Fundal Height: 31 cm Movement: Present     General:  Alert, oriented and cooperative. Patient is in no acute distress.  Skin: Skin is warm and dry. No rash noted.   Cardiovascular: Normal heart rate noted  Respiratory: Normal respiratory effort, no problems with respiration noted  Abdomen: Soft, gravid, appropriate for gestational age. Pain/Pressure: Absent     Pelvic:  Cervical exam deferred        Extremities: Normal range of motion.  Edema: None  Mental Status: Normal mood and affect. Normal behavior. Normal judgment and thought content.   Assessment   27 y.o. G3P1011 at [redacted]w[redacted]d by   10/24/2022, by Last Menstrual Period presenting for routine prenatal visit  Plan   third Problems (from 03/22/22 to present)     Problem Noted Resolved   Supervision of other normal pregnancy, antepartum 04/09/2015 by Hildred Laser, MD No   Overview Addendum 06/27/2022  9:28 AM by Tresea Mall, CNM     Clinical Staff Provider  Office Location  [facility] Dating  10/24/2022, by Last Menstrual Period  Language  English Anatomy US  Normal anatomy  Flu Vaccine  offer Genetic Screen  NIPS: negative/female  TDaP vaccine   offer Hgb A1C or  GTT Early : Third trimester :   Covid No boosters   LAB RESULTS   Rhogam  O/Positive/-- (12/28 0913)  Blood Type O/Positive/-- (12/28 0913)   Feeding Plan yes Antibody Negative (12/28 0913)  Contraception mirena Rubella 0.92 (12/28 0913)  Circumcision no RPR Non Reactive (12/28 0913)   Pediatrician  [facility] HBsAg Negative (12/28 0913)   Support Person Gerilyn Pilgrim HIV Non Reactive (12/28 0913)  Prenatal Classes no Varicella     GBS  (For PCN allergy, check sensitivities)   BTL Consent  Hep C Non Reactive (12/28 0913)   VBAC Consent  Pap Diagnosis  Date Value Ref Range Status  04/05/2022 - Low grade squamous intraepithelial lesion (LSIL) (A)  Final      Hgb Electro      CF      SMA                   Preterm labor symptoms and general obstetric precautions including but not limited to vaginal bleeding, contractions, leaking of fluid and fetal movement were reviewed in detail with the patient. Please refer to After Visit Summary for  other counseling recommendations.   Return in about 2 weeks (around 09/05/2022) for rob.  Tresea Mall, CNM 08/22/2022 9:18 AM

## 2022-09-05 ENCOUNTER — Encounter: Payer: Self-pay | Admitting: Obstetrics and Gynecology

## 2022-09-05 ENCOUNTER — Ambulatory Visit (INDEPENDENT_AMBULATORY_CARE_PROVIDER_SITE_OTHER): Payer: Medicaid Other | Admitting: Obstetrics and Gynecology

## 2022-09-05 VITALS — BP 106/65 | HR 113 | Wt 189.6 lb

## 2022-09-05 DIAGNOSIS — Z3480 Encounter for supervision of other normal pregnancy, unspecified trimester: Secondary | ICD-10-CM

## 2022-09-05 DIAGNOSIS — Z3483 Encounter for supervision of other normal pregnancy, third trimester: Secondary | ICD-10-CM

## 2022-09-05 DIAGNOSIS — Z3A33 33 weeks gestation of pregnancy: Secondary | ICD-10-CM

## 2022-09-05 LAB — POCT URINALYSIS DIPSTICK OB
Bilirubin, UA: NEGATIVE
Glucose, UA: NEGATIVE
Ketones, UA: NEGATIVE
Leukocytes, UA: NEGATIVE
Nitrite, UA: NEGATIVE
Spec Grav, UA: 1.025 (ref 1.010–1.025)
Urobilinogen, UA: 0.2 E.U./dL
pH, UA: 6.5 (ref 5.0–8.0)

## 2022-09-05 NOTE — Progress Notes (Signed)
ROB: Patient is a 27 y.o. G3P1011 at [redacted]w[redacted]d who presents for routine OB care.  Pregnancy is complicated by mild obesity affecting pregnancy. Patient denies complaints.  Size > dates noted today, will f/u at next visit. If still present, consider growth [place]. RTC in 2-3 weeks.

## 2022-09-05 NOTE — Progress Notes (Signed)
ROB [redacted]w[redacted]d: She is doing well. She reports good fetal movement with no new concerns.

## 2022-09-19 ENCOUNTER — Ambulatory Visit (INDEPENDENT_AMBULATORY_CARE_PROVIDER_SITE_OTHER): Payer: Medicaid Other

## 2022-09-19 VITALS — BP 112/64 | HR 91 | Wt 190.0 lb

## 2022-09-19 DIAGNOSIS — Z3480 Encounter for supervision of other normal pregnancy, unspecified trimester: Secondary | ICD-10-CM

## 2022-09-19 DIAGNOSIS — Z3A35 35 weeks gestation of pregnancy: Secondary | ICD-10-CM

## 2022-09-19 DIAGNOSIS — Z3483 Encounter for supervision of other normal pregnancy, third trimester: Secondary | ICD-10-CM

## 2022-09-19 NOTE — Progress Notes (Signed)
    Return Prenatal Note   Assessment/Plan   Plan  27 y.o. G3P1011 at [redacted]w[redacted]d presents for follow-up OB visit. Reviewed prenatal record including previous visit note.  Supervision of other normal pregnancy, antepartum Prepared for GBS screening swab at next visit. Measuring S=D today.  Reviewed birth planning, desires epidural.  Reviewed labor warning signs and expectations for birth. Instructed to call office or come to hospital with persistent headache, vision changes, regular contractions, leaking of fluid, decreased fetal movement or vaginal bleeding.     No orders of the defined types were placed in this encounter.  Return in about 1 week (around 09/26/2022) for ROB.   No future appointments.  For next visit:  ROB with GBS screening      Subjective   27 y.o. G3P1011 at [redacted]w[redacted]d presents for this follow-up prenatal visit.  Patient no concerns. Patient reports: Movement: Present Contractions: Not present  Objective   Flow sheet Vitals: Pulse Rate: 91 BP: 112/64 Fundal Height: 36 cm Fetal Heart Rate (bpm): 155 Presentation: Vertex Total weight gain: 20 lb (9.072 kg)  General Appearance  No acute distress, well appearing, and well nourished Pulmonary   Normal work of breathing Neurologic   Alert and oriented to person, place, and time Psychiatric   Mood and affect within normal limits  Lindalou Hose Alaric Gladwin, CNM  09/18/2408:10 AM

## 2022-09-19 NOTE — Assessment & Plan Note (Addendum)
Prepared for GBS screening swab at next visit. Measuring S=D today.  Reviewed birth planning, desires epidural.  Reviewed labor warning signs and expectations for birth. Instructed to call office or come to hospital with persistent headache, vision changes, regular contractions, leaking of fluid, decreased fetal movement or vaginal bleeding.

## 2022-09-27 ENCOUNTER — Other Ambulatory Visit (HOSPITAL_COMMUNITY)
Admission: RE | Admit: 2022-09-27 | Discharge: 2022-09-27 | Disposition: A | Discharge status: Home / Self Care | Payer: Medicaid Other | Source: Ambulatory Visit

## 2022-09-27 ENCOUNTER — Ambulatory Visit (INDEPENDENT_AMBULATORY_CARE_PROVIDER_SITE_OTHER): Payer: Medicaid Other

## 2022-09-27 VITALS — BP 100/60 | Wt 191.0 lb

## 2022-09-27 DIAGNOSIS — Z3480 Encounter for supervision of other normal pregnancy, unspecified trimester: Secondary | ICD-10-CM | POA: Diagnosis present

## 2022-09-27 DIAGNOSIS — Z3A36 36 weeks gestation of pregnancy: Secondary | ICD-10-CM

## 2022-09-27 DIAGNOSIS — Z3483 Encounter for supervision of other normal pregnancy, third trimester: Secondary | ICD-10-CM

## 2022-09-27 LAB — POCT URINALYSIS DIPSTICK OB
Bilirubin, UA: NEGATIVE
Blood, UA: NEGATIVE
Glucose, UA: NEGATIVE
Ketones, UA: NEGATIVE
Leukocytes, UA: NEGATIVE
Nitrite, UA: NEGATIVE
POC,PROTEIN,UA: NEGATIVE
Spec Grav, UA: 1.01 (ref 1.010–1.025)
Urobilinogen, UA: 1 E.U./dL
pH, UA: 6 (ref 5.0–8.0)

## 2022-09-27 NOTE — Progress Notes (Signed)
    Return Prenatal Note   Assessment/Plan   Plan  27 y.o. G3P1011 at [redacted]w[redacted]d presents for follow-up OB visit. Reviewed prenatal record including previous visit note.  Supervision of other normal pregnancy, antepartum GBS and GC/CT swabs collected today. Provided resources for encouraging timely labor. Cervical exam with shared decision making given her concern about contractions, FTP/thick/high. Reassurance provided. Reviewed labor warning signs and expectations for birth. Instructed to call office or come to hospital with persistent headache, vision changes, regular contractions, leaking of fluid, decreased fetal movement or vaginal bleeding.    Orders Placed This Encounter  Procedures   Culture, beta strep (group b only)   Return in about 1 week (around 10/04/2022).   Future Appointments  Date Time Provider Department Center  10/02/2022  1:55 PM Anandi Abramo, Lindalou Hose, CNM [facility] None  10/11/2022  9:55 AM Carrah Eppolito, Lindalou Hose, CNM [facility] None  10/18/2022  9:35 AM Hildred Laser, MD [facility] None    For next visit:  continue with routine prenatal care     Subjective   27 y.o. G3P1011 at [redacted]w[redacted]d presents for this follow-up prenatal visit.  Patient has concerns about runs of contractions that have been lasting for about 1-2 hours daily. She has about 3 contractions per hour.  Patient reports: Movement: Present Contractions: Regular  Objective   Flow sheet Vitals: BP: 100/60 Fundal Height: 36 cm Fetal Heart Rate (bpm): 155 Presentation: Vertex Dilation: Fingertip Effacement (%): Thick Station: Ballotable Total weight gain: 21 lb (9.526 kg)  General Appearance  No acute distress, well appearing, and well nourished Pulmonary   Normal work of breathing Neurologic   Alert and oriented to person, place, and time Psychiatric   Mood and affect within normal limits  Lindalou Hose Andrw Mcguirt, CNM  09/26/2408:49 AM

## 2022-09-27 NOTE — Assessment & Plan Note (Signed)
GBS and GC/CT swabs collected today. Provided resources for encouraging timely labor. Cervical exam with shared decision making given her concern about contractions, FTP/thick/high. Reassurance provided. Reviewed labor warning signs and expectations for birth. Instructed to call office or come to hospital with persistent headache, vision changes, regular contractions, leaking of fluid, decreased fetal movement or vaginal bleeding.

## 2022-09-28 LAB — CERVICOVAGINAL ANCILLARY ONLY
Chlamydia: NEGATIVE
Comment: NEGATIVE
Comment: NORMAL
Neisseria Gonorrhea: NEGATIVE

## 2022-10-01 LAB — CULTURE, BETA STREP (GROUP B ONLY): Strep Gp B Culture: NEGATIVE

## 2022-10-02 ENCOUNTER — Ambulatory Visit (INDEPENDENT_AMBULATORY_CARE_PROVIDER_SITE_OTHER): Payer: Medicaid Other

## 2022-10-02 VITALS — BP 120/70 | Wt 195.0 lb

## 2022-10-02 DIAGNOSIS — Z3480 Encounter for supervision of other normal pregnancy, unspecified trimester: Secondary | ICD-10-CM

## 2022-10-02 DIAGNOSIS — Z3483 Encounter for supervision of other normal pregnancy, third trimester: Secondary | ICD-10-CM

## 2022-10-02 DIAGNOSIS — Z3A36 36 weeks gestation of pregnancy: Secondary | ICD-10-CM

## 2022-10-02 NOTE — Progress Notes (Signed)
    Return Prenatal Note   Assessment/Plan   Plan  27 y.o. G3P1011 at [redacted]w[redacted]d presents for follow-up OB visit. Reviewed prenatal record including previous visit note.  Supervision of other normal pregnancy, antepartum Provided resources for carpal tunnel syndrome in pregnancy. Reviewed labor warning signs and expectations for birth. Instructed to call office or come to hospital with persistent headache, vision changes, regular contractions, leaking of fluid, decreased fetal movement or vaginal bleeding.    No orders of the defined types were placed in this encounter.  Return in about 1 week (around 10/09/2022) for ROB.   Future Appointments  Date Time Provider Department Center  10/11/2022  9:55 AM Bridey Brookover, Lindalou Hose, CNM [facility] None  10/18/2022  9:35 AM Hildred Laser, MD [facility] None    For next visit:  continue with routine prenatal care     Subjective   27 y.o. G3P1011 at [redacted]w[redacted]d presents for this follow-up prenatal visit.  Patient has been noting numbness in right hand and fingers that has increased in third trimester.  Patient reports: Movement: Present Contractions: Irregular  Objective   Flow sheet Vitals: BP: 120/70 Fundal Height: 155 cm Fetal Heart Rate (bpm): 37 Presentation: Vertex Total weight gain: 25 lb (11.3 kg)  General Appearance  No acute distress, well appearing, and well nourished Pulmonary   Normal work of breathing Neurologic   Alert and oriented to person, place, and time Psychiatric   Mood and affect within normal limits  Lindalou Hose Akeela Busk, CNM  07/01/242:15 PM

## 2022-10-02 NOTE — Assessment & Plan Note (Signed)
Provided resources for carpal tunnel syndrome in pregnancy. Reviewed labor warning signs and expectations for birth. Instructed to call office or come to hospital with persistent headache, vision changes, regular contractions, leaking of fluid, decreased fetal movement or vaginal bleeding.

## 2022-10-11 ENCOUNTER — Ambulatory Visit (INDEPENDENT_AMBULATORY_CARE_PROVIDER_SITE_OTHER): Payer: Medicaid Other

## 2022-10-11 VITALS — BP 106/60 | Wt 193.0 lb

## 2022-10-11 DIAGNOSIS — Z3A38 38 weeks gestation of pregnancy: Secondary | ICD-10-CM

## 2022-10-11 DIAGNOSIS — Z3480 Encounter for supervision of other normal pregnancy, unspecified trimester: Secondary | ICD-10-CM

## 2022-10-11 DIAGNOSIS — Z3483 Encounter for supervision of other normal pregnancy, third trimester: Secondary | ICD-10-CM

## 2022-10-11 NOTE — Progress Notes (Signed)
    Return Prenatal Note   Assessment/Plan   Plan  27 y.o. G3P1011 at [redacted]w[redacted]d presents for follow-up OB visit. Reviewed prenatal record including previous visit note.  Supervision of other normal pregnancy, antepartum Very ready for baby. Reviewed options for cervical exam, and after this discussion, she declines exam today. May consider at next visit.  Reviewed labor warning signs and expectations for birth. Instructed to call office or come to hospital with persistent headache, vision changes, regular contractions, leaking of fluid, decreased fetal movement or vaginal bleeding.    No orders of the defined types were placed in this encounter.  Return in about 1 week (around 10/18/2022) for ROB.   Future Appointments  Date Time Provider Department Center  10/18/2022  9:35 AM Hildred Laser, MD [facility] None    For next visit:  continue with routine prenatal care     Subjective   27 y.o. G3P1011 at [redacted]w[redacted]d presents for this follow-up prenatal visit.  Patient has no concerns. Patient reports: Movement: Present Contractions: Irregular  Objective   Flow sheet Vitals: BP: 106/60 Fundal Height: 38 cm Fetal Heart Rate (bpm): 145 Presentation: Vertex Total weight gain: 23 lb (10.4 kg)  General Appearance  No acute distress, well appearing, and well nourished Pulmonary   Normal work of breathing Neurologic   Alert and oriented to person, place, and time Psychiatric   Mood and affect within normal limits  Susan Kent, CNM  10/10/2408:20 AM

## 2022-10-11 NOTE — Assessment & Plan Note (Addendum)
Very ready for baby. Reviewed options for cervical exam, and after this discussion, she declines exam today. May consider at next visit.  Reviewed labor warning signs and expectations for birth. Instructed to call office or come to hospital with persistent headache, vision changes, regular contractions, leaking of fluid, decreased fetal movement or vaginal bleeding.

## 2022-10-18 ENCOUNTER — Encounter: Payer: Self-pay | Admitting: Obstetrics and Gynecology

## 2022-10-18 ENCOUNTER — Ambulatory Visit (INDEPENDENT_AMBULATORY_CARE_PROVIDER_SITE_OTHER): Payer: Medicaid Other | Admitting: Obstetrics and Gynecology

## 2022-10-18 VITALS — BP 108/62 | HR 96 | Wt 196.3 lb

## 2022-10-18 DIAGNOSIS — Z3483 Encounter for supervision of other normal pregnancy, third trimester: Secondary | ICD-10-CM

## 2022-10-18 DIAGNOSIS — O480 Post-term pregnancy: Secondary | ICD-10-CM

## 2022-10-18 DIAGNOSIS — O99213 Obesity complicating pregnancy, third trimester: Secondary | ICD-10-CM

## 2022-10-18 DIAGNOSIS — Z3A39 39 weeks gestation of pregnancy: Secondary | ICD-10-CM

## 2022-10-18 DIAGNOSIS — O34219 Maternal care for unspecified type scar from previous cesarean delivery: Secondary | ICD-10-CM

## 2022-10-18 LAB — POCT URINALYSIS DIPSTICK OB
Bilirubin, UA: NEGATIVE
Blood, UA: NEGATIVE
Glucose, UA: NEGATIVE
Ketones, UA: NEGATIVE
Nitrite, UA: NEGATIVE
Spec Grav, UA: 1.015 (ref 1.010–1.025)
Urobilinogen, UA: 0.2 E.U./dL
pH, UA: 6 (ref 5.0–8.0)

## 2022-10-18 NOTE — Progress Notes (Signed)
ROB: Patient is a 27 y.o. G3P1011 at [redacted]w[redacted]d who presents for routine OB care.  Pregnancy is complicated by Rubella non-immune status, antepartum; Obesity affecting pregnancy; and LGSIL on Pap smear of cervix. Patient has complaints of some contractions and pelvic pressure. Also notes that baby has not been as active over the past several weeks but typically improves with eating and drinking. Thinks her baby has no more room to move. Cervical exam performed today, attempted membrane sweeping however cervix very posterior, with funneling.  Given labor precautions. Discussed IOL if no birth by 41 weeks, ok to schedule, will be on 10/31/2022 at midnight. RTC in 1 week, for antenatal testing for postdates next week if still pregnant (BPP/growth ordered).

## 2022-10-18 NOTE — Progress Notes (Signed)
ROB [redacted]w[redacted]d: She is doing well. She has been having some contractions. She reports decreased fetal movement.

## 2022-10-25 ENCOUNTER — Encounter: Payer: Self-pay | Admitting: Certified Nurse Midwife

## 2022-10-25 ENCOUNTER — Ambulatory Visit (INDEPENDENT_AMBULATORY_CARE_PROVIDER_SITE_OTHER): Payer: Medicaid Other | Admitting: Certified Nurse Midwife

## 2022-10-25 VITALS — BP 117/73 | HR 93 | Wt 196.9 lb

## 2022-10-25 DIAGNOSIS — Z3483 Encounter for supervision of other normal pregnancy, third trimester: Secondary | ICD-10-CM

## 2022-10-25 DIAGNOSIS — Z3A40 40 weeks gestation of pregnancy: Secondary | ICD-10-CM

## 2022-10-25 NOTE — Progress Notes (Signed)
Susan Kent doing well. No concerns today. She is scheduled for BPP/growth u/s on Friday per Dr. Valentino Saxon order. She has induction scheduled for 7/30. She declines SVE today. Follow up as scheduled.   Doreene Burke, CNM

## 2022-10-25 NOTE — Patient Instructions (Signed)
Labor Induction Labor induction is when steps are taken to cause a pregnant woman to begin the labor process. Most women go into labor on their own between 37 weeks and 42 weeks of pregnancy. When this does not happen, or when there is a medical need for labor to begin, steps may be taken to induce, or bring on, labor. Labor induction causes a pregnant woman's uterus to contract. It also causes the cervix to soften (ripen), open (dilate), and thin out. Usually, labor is not induced before 39 weeks of pregnancy unless there is a medical reason to do so. When is labor induction considered? Labor induction may be right for you if: Your pregnancy lasts longer than 41 to 42 weeks. Your placenta is separating from your uterus (placental abruption). You have a rupture of membranes and your labor does not begin. You have health problems, like diabetes or high blood pressure (preeclampsia) during your pregnancy. Your baby has stopped growing or does not have enough amniotic fluid. Before labor induction begins, your health care provider will consider the following factors: Your medical condition and the baby's condition. How many weeks you have been pregnant. How mature the baby's lungs are. The condition of your cervix. The position of the baby. The size of your birth canal. Tell a health care provider about: Any allergies you have. All medicines you are taking, including vitamins, herbs, eye drops, creams, and over-the-counter medicines. Any problems you or your family members have had with anesthetic medicines. Any surgeries you have had. Any blood disorders you have. Any medical conditions you have. What are the risks? Generally, this is a safe procedure. However, problems may occur, including: Failed induction. Changes in fetal heart rate, such as being too high, too low, or irregular (erratic). Infection in the mother or the baby. Increased risk of having a cesarean delivery. Breaking off  (abruption) of the placenta from the uterus. This is rare. Rupture of the uterus. This is very rare. Your baby could fail to get enough blood flow or oxygen. This can be life-threatening. When induction is needed for medical reasons, the benefits generally outweigh the risks. What happens during the procedure? During the procedure, your health care provider will use one of these methods to induce labor: Stripping the membranes. In this method, the amniotic sac tissue is gently separated from the cervix. This causes the following to happen: Your cervix stretches, which in turn causes the release of prostaglandins. Prostaglandins induce labor and cause the uterus to contract. This procedure is often done in an office visit. You will be sent home to wait for contractions to begin. Prostaglandin medicine. This medicine starts contractions and causes the cervix to dilate and ripen. This can be taken by mouth (orally) or by being inserted into the vagina (suppository). Inserting a small, thin tube (catheter) with a balloon into the vagina and then expanding the balloon with water to dilate the cervix. Breaking the water. In this method, a small instrument is used to make a small hole in the amniotic sac. This eventually causes the amniotic sac to break. Contractions should begin within a few hours. Medicine to trigger or strengthen contractions. This medicine is given through an IV that is inserted into a vein in your arm. This procedure may vary among health care providers and hospitals. Where to find more information March of Dimes: www.marchofdimes.org The American College of Obstetricians and Gynecologists: www.acog.org Summary Labor induction causes a pregnant woman's uterus to contract. It also causes the cervix   to soften (ripen), open (dilate), and thin out. Labor is usually not induced before 39 weeks of pregnancy unless there is a medical reason to do so. When induction is needed for medical  reasons, the benefits generally outweigh the risks. Talk with your health care provider about which methods of labor induction are right for you. This information is not intended to replace advice given to you by your health care provider. Make sure you discuss any questions you have with your health care provider. Document Revised: 01/01/2020 Document Reviewed: 01/01/2020 Elsevier Patient Education  2024 [employer].

## 2022-10-27 ENCOUNTER — Inpatient Hospital Stay: Payer: Medicaid Other | Admitting: Anesthesiology

## 2022-10-27 ENCOUNTER — Inpatient Hospital Stay
Admission: EM | Admit: 2022-10-27 | Discharge: 2022-10-30 | DRG: 807 | Disposition: A | Discharge status: Home / Self Care | Payer: Medicaid Other | Attending: Obstetrics | Admitting: Obstetrics

## 2022-10-27 ENCOUNTER — Ambulatory Visit (INDEPENDENT_AMBULATORY_CARE_PROVIDER_SITE_OTHER): Payer: Medicaid Other

## 2022-10-27 ENCOUNTER — Encounter: Payer: Self-pay | Admitting: Obstetrics

## 2022-10-27 ENCOUNTER — Other Ambulatory Visit: Payer: Self-pay

## 2022-10-27 DIAGNOSIS — O480 Post-term pregnancy: Principal | ICD-10-CM | POA: Diagnosis present

## 2022-10-27 DIAGNOSIS — Z3A40 40 weeks gestation of pregnancy: Secondary | ICD-10-CM

## 2022-10-27 DIAGNOSIS — Z3480 Encounter for supervision of other normal pregnancy, unspecified trimester: Principal | ICD-10-CM

## 2022-10-27 DIAGNOSIS — O09899 Supervision of other high risk pregnancies, unspecified trimester: Secondary | ICD-10-CM

## 2022-10-27 DIAGNOSIS — Z23 Encounter for immunization: Secondary | ICD-10-CM

## 2022-10-27 DIAGNOSIS — O99214 Obesity complicating childbirth: Secondary | ICD-10-CM | POA: Diagnosis present

## 2022-10-27 DIAGNOSIS — Z2839 Other underimmunization status: Secondary | ICD-10-CM

## 2022-10-27 DIAGNOSIS — Z3483 Encounter for supervision of other normal pregnancy, third trimester: Secondary | ICD-10-CM

## 2022-10-27 LAB — CBC
HCT: 33.2 % — ABNORMAL LOW (ref 36.0–46.0)
Hemoglobin: 10.6 g/dL — ABNORMAL LOW (ref 12.0–15.0)
MCH: 23.6 pg — ABNORMAL LOW (ref 26.0–34.0)
MCHC: 31.9 g/dL (ref 30.0–36.0)
MCV: 73.9 fL — ABNORMAL LOW (ref 80.0–100.0)
Platelets: 386 10*3/uL (ref 150–400)
RBC: 4.49 MIL/uL (ref 3.87–5.11)
RDW: 16.7 % — ABNORMAL HIGH (ref 11.5–15.5)
WBC: 11.1 10*3/uL — ABNORMAL HIGH (ref 4.0–10.5)
nRBC: 0 % (ref 0.0–0.2)

## 2022-10-27 LAB — TYPE AND SCREEN
ABO/RH(D): O POS
Antibody Screen: NEGATIVE

## 2022-10-27 MED ORDER — ACETAMINOPHEN 325 MG PO TABS
650.0000 mg | ORAL_TABLET | ORAL | Status: DC | PRN
  Filled 2022-10-27 (×2): qty 2

## 2022-10-27 MED ORDER — OXYTOCIN 10 UNIT/ML IJ SOLN
INTRAMUSCULAR | Status: AC
  Filled 2022-10-27: qty 2

## 2022-10-27 MED ORDER — OXYTOCIN BOLUS FROM INFUSION
333.0000 mL | Freq: Once | INTRAVENOUS | Status: AC
  Administered 2022-10-28: 333 mL via INTRAVENOUS

## 2022-10-27 MED ORDER — AMMONIA AROMATIC IN INHA
RESPIRATORY_TRACT | Status: AC
  Filled 2022-10-27: qty 10

## 2022-10-27 MED ORDER — PHENYLEPHRINE 80 MCG/ML (10ML) SYRINGE FOR IV PUSH (FOR BLOOD PRESSURE SUPPORT): 80.0000 ug | PREFILLED_SYRINGE | INTRAVENOUS | Status: DC | PRN

## 2022-10-27 MED ORDER — LACTATED RINGERS IV SOLN
500.0000 mL | Freq: Once | INTRAVENOUS | Status: AC
  Administered 2022-10-27: 500 mL via INTRAVENOUS

## 2022-10-27 MED ORDER — DIPHENHYDRAMINE HCL 50 MG/ML IJ SOLN: 12.5000 mg | INTRAMUSCULAR | Status: DC | PRN

## 2022-10-27 MED ORDER — LIDOCAINE HCL (PF) 1 % IJ SOLN
INTRAMUSCULAR | Status: AC
  Filled 2022-10-27: qty 30

## 2022-10-27 MED ORDER — MISOPROSTOL 200 MCG PO TABS
ORAL_TABLET | ORAL | Status: AC
  Filled 2022-10-27: qty 4

## 2022-10-27 MED ORDER — SODIUM CHLORIDE 0.9 % IV SOLN
INTRAVENOUS | Status: DC | PRN
  Administered 2022-10-27: 6.5 mL via EPIDURAL

## 2022-10-27 MED ORDER — EPHEDRINE 5 MG/ML INJ: 10.0000 mg | INTRAVENOUS | Status: DC | PRN

## 2022-10-27 MED ORDER — HYDROXYZINE HCL 25 MG PO TABS: 50.0000 mg | ORAL_TABLET | Freq: Four times a day (QID) | ORAL | Status: DC | PRN

## 2022-10-27 MED ORDER — ONDANSETRON HCL 4 MG/2ML IJ SOLN: 4.0000 mg | Freq: Four times a day (QID) | INTRAMUSCULAR | Status: DC | PRN

## 2022-10-27 MED ORDER — LIDOCAINE-EPINEPHRINE (PF) 1.5 %-1:200000 IJ SOLN
INTRAMUSCULAR | Status: DC | PRN
  Administered 2022-10-27: 3 mL via EPIDURAL

## 2022-10-27 MED ORDER — LIDOCAINE HCL (PF) 1 % IJ SOLN
INTRAMUSCULAR | Status: DC | PRN
  Administered 2022-10-27: 2 mL via SUBCUTANEOUS
  Administered 2022-10-27: 3 mL via SUBCUTANEOUS

## 2022-10-27 MED ORDER — LIDOCAINE HCL (PF) 1 % IJ SOLN: 30.0000 mL | INTRAMUSCULAR | Status: DC | PRN

## 2022-10-27 MED ORDER — LACTATED RINGERS IV SOLN
500.0000 mL | INTRAVENOUS | Status: DC | PRN
  Administered 2022-10-28: 500 mL via INTRAVENOUS

## 2022-10-27 MED ORDER — SOD CITRATE-CITRIC ACID 500-334 MG/5ML PO SOLN: 30.0000 mL | ORAL | Status: DC | PRN

## 2022-10-27 MED ORDER — LACTATED RINGERS IV SOLN: INTRAVENOUS | Status: DC

## 2022-10-27 MED ORDER — FENTANYL CITRATE (PF) 100 MCG/2ML IJ SOLN: 50.0000 ug | INTRAMUSCULAR | Status: DC | PRN

## 2022-10-27 MED ORDER — OXYTOCIN-SODIUM CHLORIDE 30-0.9 UT/500ML-% IV SOLN
2.5000 [IU]/h | INTRAVENOUS | Status: DC
  Administered 2022-10-28: 2.5 [IU]/h via INTRAVENOUS

## 2022-10-27 MED ORDER — OXYTOCIN-SODIUM CHLORIDE 30-0.9 UT/500ML-% IV SOLN
INTRAVENOUS | Status: AC
  Filled 2022-10-27: qty 500

## 2022-10-27 MED ORDER — FENTANYL-BUPIVACAINE-NACL 0.5-0.125-0.9 MG/250ML-% EP SOLN
10.0000 mL/h | EPIDURAL | Status: DC | PRN
  Administered 2022-10-27: 10 mL/h via EPIDURAL
  Filled 2022-10-27: qty 250

## 2022-10-27 NOTE — H&P (Signed)
History and Physical   HPI  Susan Kent is a 27 y.o. G3P1011 at [redacted]w[redacted]d Estimated Date of Delivery: 10/24/22 who is being admitted for labor. She began having contractions around 0530. She was in the office for an AFI/growth scan today and her cervical exam at that time was 5/80/-2.  Per [place] report, growth is >90%ile and EFW is 4274 g.  TWG 12.2 kg.  OB History  OB History  Gravida Para Term Preterm AB Living  3 1 1  0 1 1  SAB IAB Ectopic Multiple Live Births  1 0 0 0 1    # Outcome Date GA Lbr Len/2nd Weight Sex Type Anes PTL Lv  3 Current           2 SAB 10/16/20     SAB     1 Term 09/05/15 [redacted]w[redacted]d  3402 g F Vag-Spont EPI  LIV     Name: Susan Kent,GIRL Susan Kent     Apgar1: 8  Apgar5: 9    PROBLEM LIST  Pregnancy complications or risks: Patient Active Problem List   Diagnosis Date Noted   Labor and delivery, indication for care 10/27/2022   LGSIL on Pap smear of cervix 08/22/2022   Obesity affecting pregnancy 06/27/2022   Supervision of other normal pregnancy, antepartum 04/09/2015   Rubella non-immune status, antepartum 02/17/2015    Prenatal labs and studies: ABO, Rh: O/Positive/-- (12/28 0913) Antibody: Negative (12/28 0913) Rubella: 0.92 (12/28 0913) RPR: Non Reactive (04/23 0934)  HBsAg: Negative (12/28 0913)  HIV: Non Reactive (04/23 0934)  WUJ:WJXBJYNW/-- (06/26 1116)   Past Medical History:  Diagnosis Date   History of anemia    Postpartum hemorrhage, delivered, current hospitalization 09/05/2015     Past Surgical History:  Procedure Laterality Date   CHOLECYSTECTOMY N/A 07/28/2016   Procedure: LAPAROSCOPIC CHOLECYSTECTOMY;  Surgeon: Henrene Dodge, MD;  Location: [facility] ORS;  Service: General;  Laterality: N/A;   FEMUR FRACTURE SURGERY  2020     Medications    Current Discharge Medication List     CONTINUE these medications which have NOT CHANGED   Details  ondansetron (ZOFRAN-ODT) 4 MG disintegrating tablet Take 1 tablet (4 mg total) by mouth every  8 (eight) hours as needed for nausea or vomiting. Qty: 12 tablet, Refills: 0    Prenatal Vit-Fe Fumarate-FA (MULTIVITAMIN-PRENATAL) 27-0.8 MG TABS tablet Take 1 tablet by mouth daily at 12 noon.         Allergies  Patient has no known allergies.  Review of Systems  Constitutional: negative Respiratory: negative Cardiovascular: negative Gastrointestinal: negative Genitourinary:positive for uterine contractions Musculoskeletal:negative Behavioral/Psych: negative  Physical Exam  LMP 01/17/2022 (Exact Date)   General: NAD Lungs:  CTAB Cardio: RRR without M/R/G Abd: Soft, gravid, NT Presentation: cephalic  CERVIX: Dilation: 5.5 Effacement (%): 80 Cervical Position: Posterior Station: -2 Presentation: Vertex Exam by:: Quitman Livings CNM  See Prenatal records for more detailed PE.     FHR:  Baseline: 145 Variability: moderate Accelerations: present Decelerations:  none Toco: irregular, every 2-5 minutes Category 1  Test Results  No results found for this or any previous visit (from the past 24 hour(s)). Group B Strep negative  Assessment   G3P1011 at [redacted]w[redacted]d Estimated Date of Delivery: 10/24/22  Reassuring maternal/fetal status.  Patient Active Problem List   Diagnosis Date Noted   Labor and delivery, indication for care 10/27/2022   LGSIL on Pap smear of cervix 08/22/2022   Obesity affecting pregnancy 06/27/2022   Supervision of other normal pregnancy, antepartum 04/09/2015  Rubella non-immune status, antepartum 02/17/2015    Plan  1. Admit to L&D 2. EFM: Continuous -- Category 1 3. Desires epidural 4. Admission labs  5. Anticipate NSVD 6.Dr. Logan Bores notified of admission  [name], [facility] 10/27/2022 6:37 PM

## 2022-10-27 NOTE — Anesthesia Preprocedure Evaluation (Signed)
Anesthesia Evaluation  Patient identified by MRN, date of birth, ID band Patient awake    Reviewed: Allergy & Precautions, NPO status , Patient's Chart, lab work & pertinent test results  History of Anesthesia Complications Negative for: history of anesthetic complications  Airway Mallampati: II  TM Distance: >3 FB Neck ROM: Full    Dental no notable dental hx. (+) Teeth Intact   Pulmonary neg pulmonary ROS, neg sleep apnea, neg COPD, Patient abstained from smoking.Not current smoker   Pulmonary exam normal breath sounds clear to auscultation       Cardiovascular Exercise Tolerance: Good METS(-) hypertension(-) CAD and (-) Past MI negative cardio ROS (-) dysrhythmias  Rhythm:Regular Rate:Normal - Systolic murmurs    Neuro/Psych negative neurological ROS  negative psych ROS   GI/Hepatic ,neg GERD  ,,(+)     (-) substance abuse    Endo/Other  neg diabetes    Renal/GU negative Renal ROS Bladder dysfunction      Musculoskeletal   Abdominal  (+) + obese  Peds  Hematology  (+) Blood dyscrasia, anemia No bleeding disorders or blood thinners   Anesthesia Other Findings Past Medical History: No date: History of anemia 09/05/2015: Postpartum hemorrhage, delivered, current hospitalization  Reproductive/Obstetrics (+) Pregnancy                             Anesthesia Physical Anesthesia Plan  ASA: 2  Anesthesia Plan: Epidural   Post-op Pain Management:    Induction:   PONV Risk Score and Plan: 2 and Treatment may vary due to age or medical condition and Ondansetron  Airway Management Planned: Natural Airway  Additional Equipment:   Intra-op Plan:   Post-operative Plan:   Informed Consent: I have reviewed the patients History and Physical, chart, labs and discussed the procedure including the risks, benefits and alternatives for the proposed anesthesia with the patient or  authorized representative who has indicated his/her understanding and acceptance.       Plan Discussed with: Surgeon  Anesthesia Plan Comments: (Discussed R/B/A of neuraxial anesthesia technique with patient: - rare risks of spinal/epidural hematoma, nerve damage, infection - Risk of PDPH - Risk of itching - Risk of nausea and vomiting - Risk of poor block necessitating replacement of epidural. - Risk of allergic reactions. Patient voiced understanding.)       Anesthesia Quick Evaluation

## 2022-10-27 NOTE — Anesthesia Procedure Notes (Signed)
Epidural Patient location during procedure: OB  Staffing Anesthesiologist: Corinda Gubler, MD Performed: anesthesiologist   Preanesthetic Checklist Completed: patient identified, IV checked, site marked, risks and benefits discussed, surgical consent, monitors and equipment checked, pre-op evaluation and timeout performed  Epidural Patient position: sitting Prep: ChloraPrep Patient monitoring: heart rate, continuous pulse ox and blood pressure Approach: midline Location: L2-L3 Injection technique: LOR saline  Needle:  Needle type: Tuohy  Needle gauge: 17 G Needle length: 9 cm Needle insertion depth: 6 cm Catheter type: closed end flexible Catheter size: 19 Gauge Catheter at skin depth: 11 cm Test dose: negative and 1.5% lidocaine with Epi 1:200 K  Assessment Sensory level: T10 Events: blood not aspirated, no cerebrospinal fluid, injection not painful, no injection resistance, no paresthesia and negative IV test  Additional Notes two insertion sites attempted in total. Pt. Evaluated and documentation done after procedure finished. Patient identified. Risks/Benefits/Options discussed with patient including but not limited to bleeding, infection, nerve damage, paralysis, failed block, incomplete pain control, headache, blood pressure changes, nausea, vomiting, reactions to medication both or allergic, itching and postpartum back pain. Confirmed with bedside nurse the patient's most recent platelet count. Confirmed with patient that they are not currently taking any anticoagulation, have any bleeding history or any family history of bleeding disorders. Patient expressed understanding and wished to proceed. All questions were answered. Sterile technique was used throughout the entire procedure. Please see nursing notes for vital signs. Test dose was given through epidural catheter and negative prior to continuing to dose epidural or start infusion. Warning signs of high block given to the  patient including shortness of breath, tingling/numbness in hands, complete motor block, or any concerning symptoms with instructions to call for help. Patient was given instructions on fall risk and not to get out of bed. All questions and concerns addressed with instructions to call with any issues or inadequate analgesia.     Patient tolerated the insertion well without immediate complications.  Reason for block: procedure for painReason for block:procedure for pain

## 2022-10-27 NOTE — Progress Notes (Signed)
LABOR NOTE   SUBJECTIVE:   Susan Kent is a 27 y.o.  G3P1011  at [redacted]w[redacted]d in active labor. She is comfortable with her epidural. She would like AROM at this time.  Analgesia: Epidural  OBJECTIVE:  BP (!) 106/59   Pulse 93   Temp 98.1 F (36.7 C) (Oral)   Resp 16   Ht 5' (1.524 m)   Wt 88.9 kg   LMP 01/17/2022 (Exact Date)   SpO2 99%   BMI 38.28 kg/m  No intake/output data recorded.  SVE:   Dilation: 7 Effacement (%): 100 Station: 0 Exam by:: Susan Kent CNM CONTRACTIONS: regular, every 2-4 minutes FHR: Fetal heart tracing reviewed. Baseline: 155 Variability: moderate Accelerations: present Decelerations:none Category 1  Labs: Lab Results  Component Value Date   WBC 11.1 (H) 10/27/2022   HGB 10.6 (L) 10/27/2022   HCT 33.2 (L) 10/27/2022   MCV 73.9 (L) 10/27/2022   PLT 386 10/27/2022    ASSESSMENT:     Spontaneous labor, progressing normally     Coping: great. Husband and SIL supportive at bedside.     Membranes: ruptured, clear fluid           Principal Problem:   Labor and delivery, indication for care  Suspected macrosomia  PLAN: Expectant management Anticipate NSVD Dr. Logan Kent updated, plans to be in hospital for birth  Susan Kent, [facility] 10/27/2022 9:42 PM

## 2022-10-27 NOTE — Progress Notes (Unsigned)
Verdie having contractions during BPP, SVE completed, 5/80/-3, vertex. Bar will go home to gather her things & she and her partner will then present to L&D at [facility]. On-call CNM, Quitman Livings, notified, L&D RN notified.

## 2022-10-28 ENCOUNTER — Encounter: Payer: Self-pay | Admitting: Obstetrics and Gynecology

## 2022-10-28 MED ORDER — DOCUSATE SODIUM 100 MG PO CAPS
100.0000 mg | ORAL_CAPSULE | Freq: Two times a day (BID) | ORAL | Status: DC
  Administered 2022-10-29 – 2022-10-30 (×3): 100 mg via ORAL
  Filled 2022-10-28 (×3): qty 1

## 2022-10-28 MED ORDER — IBUPROFEN 600 MG PO TABS
600.0000 mg | ORAL_TABLET | Freq: Four times a day (QID) | ORAL | Status: DC
  Administered 2022-10-28 – 2022-10-30 (×6): 600 mg via ORAL
  Filled 2022-10-28 (×6): qty 1

## 2022-10-28 MED ORDER — WITCH HAZEL-GLYCERIN EX PADS
MEDICATED_PAD | CUTANEOUS | Status: DC | PRN
  Filled 2022-10-28 (×2): qty 100

## 2022-10-28 MED ORDER — SIMETHICONE 80 MG PO CHEW: 80.0000 mg | CHEWABLE_TABLET | ORAL | Status: DC | PRN

## 2022-10-28 MED ORDER — METHYLERGONOVINE MALEATE 0.2 MG/ML IJ SOLN: 0.2000 mg | INTRAMUSCULAR | Status: DC | PRN

## 2022-10-28 MED ORDER — METHYLERGONOVINE MALEATE 0.2 MG PO TABS: 0.2000 mg | ORAL_TABLET | ORAL | Status: DC | PRN

## 2022-10-28 MED ORDER — OXYCODONE-ACETAMINOPHEN 5-325 MG PO TABS: 1.0000 | ORAL_TABLET | ORAL | Status: DC | PRN

## 2022-10-28 MED ORDER — CALCIUM CARBONATE ANTACID 500 MG PO CHEW: 2.0000 | CHEWABLE_TABLET | ORAL | Status: DC | PRN

## 2022-10-28 MED ORDER — DIPHENHYDRAMINE HCL 25 MG PO CAPS: 25.0000 mg | ORAL_CAPSULE | Freq: Four times a day (QID) | ORAL | Status: DC | PRN

## 2022-10-28 MED ORDER — OXYTOCIN-SODIUM CHLORIDE 30-0.9 UT/500ML-% IV SOLN: 2.5000 [IU]/h | INTRAVENOUS | Status: DC | PRN

## 2022-10-28 MED ORDER — MEASLES, MUMPS & RUBELLA VAC IJ SOLR
0.5000 mL | Freq: Once | INTRAMUSCULAR | Status: AC
  Administered 2022-10-30: 0.5 mL via SUBCUTANEOUS
  Filled 2022-10-28 (×2): qty 0.5

## 2022-10-28 MED ORDER — FERROUS SULFATE 325 (65 FE) MG PO TABS
325.0000 mg | ORAL_TABLET | Freq: Every day | ORAL | Status: DC
  Administered 2022-10-28 – 2022-10-30 (×3): 325 mg via ORAL
  Filled 2022-10-28 (×3): qty 1

## 2022-10-28 MED ORDER — PRENATAL MULTIVITAMIN CH
1.0000 | ORAL_TABLET | Freq: Every day | ORAL | Status: DC
  Administered 2022-10-28 – 2022-10-30 (×2): 1 via ORAL
  Filled 2022-10-28 (×2): qty 1

## 2022-10-28 MED ORDER — BENZOCAINE-MENTHOL 20-0.5 % EX AERO
1.0000 | INHALATION_SPRAY | CUTANEOUS | Status: DC | PRN
  Administered 2022-10-29: 1 via TOPICAL
  Filled 2022-10-28 (×3): qty 56

## 2022-10-28 MED ORDER — ACETAMINOPHEN 325 MG PO TABS
650.0000 mg | ORAL_TABLET | ORAL | Status: DC | PRN
  Administered 2022-10-28 – 2022-10-29 (×2): 650 mg via ORAL

## 2022-10-28 MED ORDER — COCONUT OIL OIL
1.0000 | TOPICAL_OIL | Status: DC | PRN
  Filled 2022-10-28: qty 7.5
  Filled 2022-10-28: qty 15

## 2022-10-28 NOTE — Lactation Note (Signed)
This note was copied from a baby's chart. Lactation Consultation Note  Patient Name: Susan Kent [id number] Date: 10/28/2022 Age:25 hours Reason for consult: Follow-up assessment   Maternal Data    Feeding Mother's Current Feeding Choice: Breast Milk Awakened baby to feed, latched after few attempts to right breast in cradle hold, nursing well with swallows noted  LATCH Score Latch: Grasps breast easily, tongue down, lips flanged, rhythmical sucking.  Audible Swallowing: Spontaneous and intermittent  Type of Nipple: Everted at rest and after stimulation  Comfort (Breast/Nipple): Soft / non-tender  Hold (Positioning): Assistance needed to correctly position infant at breast and maintain latch.  LATCH Score: 9   Lactation Tools Discussed/Used    Interventions Interventions: Assisted with latch  Discharge    Consult Status Consult Status: PRN Date: 10/29/22 Follow-up type: In-patient    [name] 10/28/2022, 5:55 PM

## 2022-10-28 NOTE — Discharge Summary (Signed)
Postpartum Discharge Summary  Date of Service updated: 10/30/2022     Patient Name: Susan Kent DOB: Aug 26, 1995 MRN: [id number]  Date of admission: 10/27/2022 Delivery date:10/28/2022 Delivering provider: Glenetta Borg Date of discharge: 10/30/2022 Admitting diagnosis: Labor and delivery, indication for care [O75.9] Intrauterine pregnancy: [redacted]w[redacted]d     Secondary diagnosis:  Principal Problem:   Labor and delivery, indication for care Active Problems:   Rubella non-immune status, antepartum   Postpartum care following vaginal delivery   Encounter for care or examination of lactating mother  Additional problems: None    Discharge diagnosis: Term Pregnancy Delivered                                              Post partum procedures: NA Augmentation: AROM Complications: None  Hospital course: Onset of Labor With Vaginal Delivery      27 y.o. yo [id number] at [redacted]w[redacted]d was admitted in Active Labor on 10/27/2022. Labor course was uncomplicated. Membrane Rupture Time/Date: 9:38 PM,10/27/2022  Delivery Method:Vaginal, Spontaneous Episiotomy: None Lacerations:  1st degree;Labial See delivery note for details  Patient had a postpartum course complicated by NA.  She is ambulating, tolerating a regular diet, passing flatus, and urinating well. Newborn has been in SCN for TTN and may be able to discharge today. Mom has been pumping and plans to breastfeed.   Patient is discharged home in stable condition on 10/30/2022.  Newborn Data: Birth date:10/28/2022 Birth time:3:02 AM Gender:Female  [place] status:Living Apgars:8 ,9  Weight:3910 g  Magnesium Sulfate received: No BMZ received: No Rhophylac:N/A MMR: offered postpartum T-DaP:Given prenatally Flu: N/A Transfusion:No  Physical exam  Vitals:   10/29/22 1003 10/29/22 1620 10/30/22 0047 10/30/22 0907  BP: 103/67 105/67 104/76 (!) 98/54  Pulse: 69 68 71 75  Resp: 20 18 18 20   Temp: 97.7 F (36.5 C) 97.7 F (36.5 C)  97.6 F (36.4 C) 98.4 F (36.9 C)  TempSrc: Oral Oral Oral Oral  SpO2: 100%   99%  Weight:      Height:       General: alert, cooperative, and no distress Lochia: appropriate Uterine Fundus: firm Incision: N/A DVT Evaluation: No evidence of DVT seen on physical exam. Labs: Lab Results  Component Value Date   WBC 11.1 (H) 10/27/2022   HGB 10.6 (L) 10/27/2022   HCT 33.2 (L) 10/27/2022   MCV 73.9 (L) 10/27/2022   PLT 386 10/27/2022      Latest Ref Rng & Units 05/24/2022    2:17 PM  CMP  Glucose 70 - 99 mg/dL 93   BUN 6 - 20 mg/dL 8   Creatinine 0.10 - 9.32 mg/dL 3.55   Sodium 732 - 202 mmol/L 133   Potassium 3.5 - 5.1 mmol/L 3.3   Chloride 98 - 111 mmol/L 105   CO2 22 - 32 mmol/L 20   Calcium 8.9 - 10.3 mg/dL 8.9   Total Protein 6.5 - 8.1 g/dL 7.4   Total Bilirubin 0.3 - 1.2 mg/dL 0.7   Alkaline Phos 38 - 126 U/L 86   AST 15 - 41 U/L 22   ALT 0 - 44 U/L 17    Edinburgh Score:    10/28/2022    8:00 AM  Edinburgh Postnatal Depression Scale Screening Tool  I have been able to laugh and see the funny side of things. 0  I  have looked forward with enjoyment to things. 0  I have blamed myself unnecessarily when things went wrong. 0  I have been anxious or worried for no good reason. 0  I have felt scared or panicky for no good reason. 0  Things have been getting on top of me. 0  I have been so unhappy that I have had difficulty sleeping. 0  I have felt sad or miserable. 0  I have been so unhappy that I have been crying. 0  The thought of harming myself has occurred to me. 0  Edinburgh Postnatal Depression Scale Total 0      After visit meds:  Allergies as of 10/30/2022   No Known Allergies      Medication List     STOP taking these medications    ondansetron 4 MG disintegrating tablet Commonly known as: ZOFRAN-ODT       TAKE these medications    multivitamin-prenatal 27-0.8 MG Tabs tablet Take 1 tablet by mouth daily at 12 noon.          Discharge home in stable condition Infant Feeding: Breast Infant Disposition: pending progress today Discharge instruction: per After Visit Summary and Postpartum booklet. Activity: Advance as tolerated. Pelvic rest for 6 weeks.  Diet: routine diet Anticipated Birth Control: IUD  Mirena Postpartum Appointment: 2 week and 6 week Additional Postpartum F/U:  PRN Future Appointments:No future appointments.  Follow up Visit:  Follow-up Information     Glenetta Borg, CNM. Schedule an appointment as soon as possible for a visit.   Specialty: Obstetrics Why: Video visit in 2 weeks Office visit in 6 weeks Contact information: [address] [place] [address] [phone]                   Tresea Mall, CNM [facility] 10/30/2022 10:48 AM

## 2022-10-28 NOTE — Lactation Note (Signed)
This note was copied from a baby's chart. Lactation Consultation Note  Patient Name: Susan Kent IONGE'X Date: 10/28/2022 Age:25 hours Reason for consult: Initial assessment;Term   Maternal Data Has patient been taught Hand Expression?: Yes Does the patient have breastfeeding experience prior to this delivery?: Yes How long did the patient breastfeed?: 1-2 days  Feeding Mother's Current Feeding Choice: Breast Milk Assisted with latch to left breast, mom states baby latches better to right and nurses longer, left nipple is shorter and smaller than right, attempted on left in cradle hold but difficult to maintain latch, mom turned to left side with baby beside her, baby gaggy, swallowing mucous, after this over, latched easier in side lying position, mom feels tugging, mom shown how to widen latch by chin pressure, as room left baby was still nursing, had come off twice but relatched easily, mom states that baby nursing better this time.   LATCH Score Latch: Repeated attempts needed to sustain latch, nipple held in mouth throughout feeding, stimulation needed to elicit sucking reflex. (left breast)  Audible Swallowing: A few with stimulation  Type of Nipple: Everted at rest and after stimulation (left nipple smaller than right)  Comfort (Breast/Nipple): Filling, red/small blisters or bruises, mild/mod discomfort (left nipple tender)  Hold (Positioning): Assistance needed to correctly position infant at breast and maintain latch.  LATCH Score: 6   Lactation Tools Discussed/Used  LC name and no written on white board  Interventions Interventions: Breast feeding basics reviewed;Assisted with latch;Skin to skin;Hand express;Adjust position;Coconut oil;Education  Discharge Pump: Personal WIC Program: No  Consult Status Consult Status: Follow-up Date: 10/28/22 Follow-up type: In-patient    [name] 10/28/2022, 11:38 AM

## 2022-10-28 NOTE — Progress Notes (Signed)
LABOR NOTE   SUBJECTIVE:   Susan Kent is a 27 y.o.  G3P1011  at [redacted]w[redacted]d who is laboring spontaneously. She is comfortable with her epidural and making steady progress.  Analgesia: Epidural  OBJECTIVE:  BP 114/60   Pulse 87   Temp 98.6 F (37 C) (Oral)   Resp 18   Ht 5' (1.524 m)   Wt 88.9 kg   LMP 01/17/2022 (Exact Date)   SpO2 98%   BMI 38.28 kg/m  No intake/output data recorded.  SVE:   Dilation: Lip/rim Effacement (%): 100 Station: Plus 1 Exam by:: Kareema Keitt CNM CONTRACTIONS: regular, every 1-3 minutes FHR: Fetal heart tracing reviewed. Baseline: 150 Variability: moderate, minimal Accelerations: present Decelerations:none Category 1/2  Labs: Lab Results  Component Value Date   WBC 11.1 (H) 10/27/2022   HGB 10.6 (L) 10/27/2022   HCT 33.2 (L) 10/27/2022   MCV 73.9 (L) 10/27/2022   PLT 386 10/27/2022    ASSESSMENT: Spontaneous labor, progressing normally Coping: in good spirits Membranes: ruptured, clear fluid  Principal Problem:   Labor and delivery, indication for care  Labor course: 1820: 5.5/80/-2 2138: 7/100/0, AROM 2317: 8.5/100/0 0130: 9.5/100/+1  PLAN: Expectant management Anticipate NSVD  Susan Kent, CNM 10/28/2022 1:35 AM

## 2022-10-28 NOTE — Plan of Care (Signed)
  Problem: Education: Goal: Knowledge of Childbirth will improve Outcome: Completed/Met Goal: Ability to make informed decisions regarding treatment and plan of care will improve Outcome: Completed/Met Goal: Ability to state and carry out methods to decrease the pain will improve Outcome: Completed/Met   Problem: Coping: Goal: Ability to verbalize concerns and feelings about labor and delivery will improve Outcome: Completed/Met   Problem: Life Cycle: Goal: Ability to make normal progression through stages of labor will improve Outcome: Completed/Met Goal: Ability to effectively push during vaginal delivery will improve Outcome: Completed/Met   Problem: Role Relationship: Goal: Will demonstrate positive interactions with the child Outcome: Completed/Met   Problem: Safety: Goal: Risk of complications during labor and delivery will decrease Outcome: Completed/Met   Problem: Pain Management: Goal: Relief or control of pain from uterine contractions will improve Outcome: Completed/Met

## 2022-10-29 NOTE — Anesthesia Post-op Follow-up Note (Signed)
  Anesthesia Pain Follow-up Note  Patient: Susan Kent  Day #: 1  Date of Follow-up: 10/29/2022 Time: 8:51 AM  Last Vitals:  Vitals:   10/28/22 1946 10/28/22 2359  BP: (!) 103/59 (!) 96/51  Pulse: 78 70  Resp: 20 20  Temp: 36.8 C 36.7 C  SpO2: 99% 99%    Level of Consciousness: alert  Pain: none   Side Effects:None  Catheter Site Exam:clean, dry, no drainage     Plan: D/C from anesthesia care at surgeon's request  [employer]

## 2022-10-29 NOTE — Progress Notes (Signed)
   Subjective:   Susan Kent had a NSVB on 10/28/22. Her labor was uncomplicated. Has had routine postpartum care.  Patient is eating, hydrating, and voiding regularly without difficulty. Has yet to have BM. She is  breastfeeding and pumping . Reports mild vaginal bleeding, denies passing large blood clots. Has had cramping abdomen pain relieved with tylenol/ibuprofen. Denies anxiety/depression symptoms. Endorses good support from partner and family.   Infant currently admitted to the [facility] for TTN per patient. Patient visited infant this morning and states she is doing much better this morning. They are hopeful infant will return to room later today.  Objective:  Vital signs in last 24 hours: Temp:  [97.7 F (36.5 C)-98.3 F (36.8 C)] 97.7 F (36.5 C) (07/28 1003) Pulse Rate:  [69-79] 69 (07/28 1003) Resp:  [18-20] 20 (07/28 1003) BP: (96-106)/(51-67) 103/67 (07/28 1003) SpO2:  [99 %-100 %] 100 % (07/28 1003)    General: NAD Pulmonary: no increased work of breathing Breasts: soft, non-tender, nipples without breakdown Abdomen: soft, non-tender Fundus: firm, midline, U-1 Lochia: light rubra, no clots Extremities: no edema, no erythema, no tenderness  Results for orders placed or performed during the hospital encounter of 10/27/22 (from the past 72 hour(s))  CBC     Status: Abnormal   Collection Time: 10/27/22  6:35 PM  Result Value Ref Range   WBC 11.1 (H) 4.0 - 10.5 K/uL   RBC 4.49 3.87 - 5.11 MIL/uL   Hemoglobin 10.6 (L) 12.0 - 15.0 g/dL   HCT 19.1 (L) 47.8 - 29.5 %   MCV 73.9 (L) 80.0 - 100.0 fL   MCH 23.6 (L) 26.0 - 34.0 pg   MCHC 31.9 30.0 - 36.0 g/dL   RDW 62.1 (H) 30.8 - 65.7 %   Platelets 386 150 - 400 K/uL   nRBC 0.0 0.0 - 0.2 %    Comment: Performed at [facility], [address] [place], [place] [address]  RPR     Status: None   Collection Time: 10/27/22  6:35 PM  Result Value Ref Range   RPR Ser Ql NON REACTIVE NON REACTIVE    Comment: Performed  at [facility], [address], [place], [place] [address]  Type and screen [facility]     Status: None   Collection Time: 10/27/22  6:36 PM  Result Value Ref Range   ABO/RH(D) O POS    Antibody Screen NEG    Sample Expiration      10/30/2022,2359 Performed at [facility], [address] [place], [place] [address]     Assessment:   27 y.o. [phone] 1 day(s)  s/p NSVB Breastfeeding and pumping VSS Pain well controlled  Plan:    Blood Type --/--/O POS (07/26 1836) / Rubella 0.92 (12/28 0913) / Varicella Immune Rhogam not indicated Feeding plan breast, lactation support Encouraged to continue breastfeeding and pumping every 2-3 hours while infant is in the SCN. Continued routine postpartum care  Counseled on normal uterine involution and vaginal bleeding postpartum Anticipate discharge home tomorrow    Burney Gauze, CNM [facility] 10/29/2022, 10:52 AM

## 2022-10-29 NOTE — Anesthesia Postprocedure Evaluation (Signed)
Anesthesia Post Note  Patient: Susan Kent  Procedure(s) Performed: AN AD HOC LABOR EPIDURAL  Patient location during evaluation: Mother Baby Anesthesia Type: Epidural Level of consciousness: awake and alert Pain management: pain level controlled Vital Signs Assessment: post-procedure vital signs reviewed and stable Respiratory status: spontaneous breathing, nonlabored ventilation and respiratory function stable Cardiovascular status: stable Postop Assessment: no headache, no backache and epidural receding Anesthetic complications: no  No notable events documented.   Last Vitals:  Vitals:   10/28/22 1946 10/28/22 2359  BP: (!) 103/59 (!) 96/51  Pulse: 78 70  Resp: 20 20  Temp: 36.8 C 36.7 C  SpO2: 99% 99%    Last Pain:  Vitals:   10/28/22 2359  TempSrc: Oral  PainSc:                  Stephanie Coup

## 2022-10-30 NOTE — Progress Notes (Signed)
Patient discharged home with infant. Discharge instructions and prescriptions given and reviewed with patient. Patient verbalized understanding. Escorted out by auxillary.  

## 2022-10-30 NOTE — Discharge Instructions (Addendum)
Discharge Instructions:  ° °Follow-up Appointment:  ° °If there are any new medications, they have been ordered and will be available for pickup at the listed pharmacy on your way home from the hospital.  ° °Call office if you have any of the following: headache, visual changes, fever >101.0 F, chills, shortness of breath, breast concerns, excessive vaginal bleeding, incision drainage or problems, leg pain or redness, depression or any other concerns. If you have vaginal discharge with an odor, let your doctor know.  ° °It is normal to bleed for up to 6 weeks. You should not soak through more than 1 pad in 1 hour. If you have a blood clot larger than your fist with continued bleeding, call your doctor.  ° °After a c-section, you should expect a small amount of blood or clear fluid coming from the incision and abdominal cramping/soreness. Inspect your incision site daily. Stand in front of a mirror to look for any redness, incision opening, or discolored/odorness drainage. Take a shower daily and continue good hygiene. Use own towel and washcloth (do not share). Make sure your sheets on your bed are clean. No pets sleeping around your incision site. Dressing will be removed at your postpartum visit. If the dressing does become wet or soiled underneath, it is okay to remove it.  ° °Activity: Do not lift > 10 lbs for 6 weeks (do not lift anything heavier than your baby). °No intercourse, tampons, swimming pools, hot tubs, baths (only showers) for 6 weeks.  °No driving for 1-2 weeks. °Continue prenatal vitamin, especially if breastfeeding. °Increase calories and fluids (water) while breastfeeding.  ° °Your milk will come in, in the next couple of days (right now it is colostrum). You may have a slight fever when your milk comes in, but it should go away on its own.  If it does not, and rises above 101 F please call the doctor. You will also feel achy and your breasts will be firm. They will also start to leak. If you  are breastfeeding, continue as you have been and you can pump/express milk for comfort.  ° °If you have too much milk, your breasts can become engorged, which could lead to mastitis. This is an infection of the milk ducts. It can be very painful and you will need to notify your doctor to obtain a prescription for antibiotics. You can also treat it with a shower or hot/cold compress.  ° °For concerns about your baby, please call your pediatrician.  °For breastfeeding concerns, the lactation consultant can be reached at [phone].  ° °Postpartum blues (feelings of happy one minute and sad another minute) are normal for the first few weeks but if it gets worse let your doctor know.  ° °Congratulations! We enjoyed caring for you and your new bundle of joy!  °

## 2022-10-31 ENCOUNTER — Telehealth: Payer: Self-pay

## 2022-10-31 NOTE — Telephone Encounter (Signed)
[facility]- Discharge Call Backs-Left Voicemail about the following below. 1-Do you have any questions or concerns about yourself as you heal? 2-Any concerns or questions about your baby? 3-How was your stay at the hospital? 4- Did our team work together to care for you? You should be receiving a survey in the mail soon.   We would really appreciate it if you could fill that out for us and return it in the mail.  We value the feedback to make improvements and continue the great work we do.   If you have any questions please feel free to call me back at [phone]  

## 2022-11-13 ENCOUNTER — Telehealth (INDEPENDENT_AMBULATORY_CARE_PROVIDER_SITE_OTHER): Payer: Medicaid Other | Admitting: Obstetrics

## 2022-11-13 NOTE — Progress Notes (Signed)
Virtual Visit via Video Note  I connected with Susan Kent on 11/13/22 at 10:55 AM EDT by a video enabled telemedicine application and verified that I am speaking with the correct person using two identifiers.  Location: Patient: home Provider: office   I discussed the limitations of evaluation and management by telemedicine and the availability of in person appointments. The patient expressed understanding and agreed to proceed.  History of Present Illness: Susan Kent is a 27 y.o. [id number] who is s/p NSVD on 10/28/22. She had an uncomplicated labor course and received epidural analgesia. She gave birth to a viable female, Susan Kent. She had a first degree laceration that was not repaired. She is breastfeeding and pumping.   Observations/Objective: -Mood: feels great. EPDS 0 -Pain: denies -Breasts: no concerns. Had a cracked nipple but it has healed. -Appetite: great - eating plenty -Bowel: no concerns -Bladder: no concerns, no incontinence -Perineum: healing well -Bleeding: light -Infant: growing well, no concerns  Assessment and Plan: -Normal 2-week PP course -Anticipatory guidance about the next few weeks -Desires Mirena. Algene will call the front desk to schedule 6-week visit and IUD placement.  Follow Up Instructions:    I discussed the assessment and treatment plan with the patient. The patient was provided an opportunity to ask questions and all were answered. The patient agreed with the plan and demonstrated an understanding of the instructions.   The patient was advised to call back or seek an in-person evaluation if the symptoms worsen or if the condition fails to improve as anticipated.  I provided 8 minutes of non-face-to-face time during this encounter.   Glenetta Borg, CNM

## 2022-12-13 ENCOUNTER — Ambulatory Visit (INDEPENDENT_AMBULATORY_CARE_PROVIDER_SITE_OTHER): Payer: Medicaid Other | Admitting: Obstetrics

## 2022-12-13 ENCOUNTER — Encounter: Payer: Self-pay | Admitting: Obstetrics

## 2022-12-13 DIAGNOSIS — Z3202 Encounter for pregnancy test, result negative: Secondary | ICD-10-CM | POA: Diagnosis not present

## 2022-12-13 DIAGNOSIS — Z30430 Encounter for insertion of intrauterine contraceptive device: Secondary | ICD-10-CM

## 2022-12-13 DIAGNOSIS — N368 Other specified disorders of urethra: Secondary | ICD-10-CM

## 2022-12-13 LAB — POCT URINE PREGNANCY: Preg Test, Ur: NEGATIVE

## 2022-12-13 MED ORDER — LEVONORGESTREL 20 MCG/DAY IU IUD
1.0000 | INTRAUTERINE_SYSTEM | Freq: Once | INTRAUTERINE | Status: AC
Start: 2022-12-13 — End: 2022-12-13
  Administered 2022-12-13: 1 via INTRAUTERINE

## 2022-12-13 NOTE — Progress Notes (Signed)
Post Partum Visit Note  Susan Kent is a 27 y.o. G53P2012 female who presents for a postpartum visit. She is 6 week postpartum following a normal spontaneous vaginal delivery.  I have fully reviewed the prenatal and intrapartum course. The delivery was at [redacted]w[redacted]d.  Anesthesia: epidural. Postpartum course has been uncomplicated. Baby is doing well. Baby is feeding by  breast and expressed breast milk . Bleeding: spotting. Bowel function is normal. Bladder function is normal. Patient is not sexually active. Contraception method: desires IUD. Postpartum depression screening: negative.   The pregnancy intention screening data noted above was reviewed. Potential methods of contraception were discussed. The patient elected to proceed with No data recorded.   Edinburgh Postnatal Depression Scale - 12/13/22 0910       Edinburgh Postnatal Depression Scale:  In the Past 7 Days   I have been able to laugh and see the funny side of things. 0    I have looked forward with enjoyment to things. 0    I have blamed myself unnecessarily when things went wrong. 0    I have been anxious or worried for no good reason. 0    I have felt scared or panicky for no good reason. 0    Things have been getting on top of me. 0    I have been so unhappy that I have had difficulty sleeping. 0    I have felt sad or miserable. 0    I have been so unhappy that I have been crying. 0    The thought of harming myself has occurred to me. 0    Edinburgh Postnatal Depression Scale Total 0             Health Maintenance Due  Topic Date Due   HPV VACCINES (1 - 3-dose series) Never done   INFLUENZA VACCINE  11/02/2022   COVID-19 Vaccine (3 - 2023-24 season) 12/03/2022    The following portions of the patient's history were reviewed and updated as appropriate: allergies, current medications, past medical history, past surgical history, and problem list.  Review of Systems Pertinent items are noted in  HPI.  Objective:  BP 106/67   Pulse 76   Ht 5' (1.524 m)   Wt 169 lb (76.7 kg)   LMP  (LMP Unknown)   Breastfeeding Yes   BMI 33.01 kg/m    General:  alert, cooperative, and appears stated age   Breasts:  normal  Lungs: clear to auscultation bilaterally  Heart:  regular rate and rhythm, S1, S2 normal, no murmur, click, rub or gallop  Abdomen: soft, non-tender; bowel sounds normal; no masses,  no organomegaly   Wound: N/A  GU exam:  Normal vulva, vagina, and cervix. 3 cm periurethral cyst         UPT: negative  Procedure Note Consent was obtained prior to the procedure. A bimanual exam was performed to determine the position of the uterus. A sterile speculum was placed in the vagina, and the cervix was visualized. Betadine was applied to the cervix followed by 4% lidocaine. An Lavena Bullion was placed on the anterior lip of the cervix, and gentle traction was applied to straighten and stabilize it. The uterus was sounded to about 6 cm. The IUD was inserted to the appropriate depth and the insertion tool was removed. The strings were trimmed to about 3 cm. Bleeding was minimal. Yossible tolerated the procedure well. The cyst was injected with 1% lidocaine. An 18-gauge needle was used to  create a small hole and yellowish purulent discharge was expressed. Hemostasis occurred spontaneously.  Post-procedure care and warning signs were reviewed with Rachel  Follow up for annual visit or PRN.  [name], CNM  Assessment:   1. 6-week postpartum exam 2. IUD placement 3. Drainage of periurethral cyst    Plan:   Essential components of care per ACOG recommendations:  1.  Mood and well being: Patient with negative depression screening today. Reviewed local resources for support.  - Patient tobacco use? No.   - hx of drug use? No.    2. Infant care and feeding:  -Patient currently breastmilk feeding? Yes. Reviewed importance of draining breast regularly to support lactation.   -Social determinants of health (SDOH) reviewed in EPIC. No concerns.  3. Sexuality, contraception and birth spacing - Patient does not want a pregnancy in the next year.  D - Reviewed reproductive life planning. Reviewed contraceptive methods based on pt preferences and effectiveness.  Patient desired IUD or IUS today.    4. Sleep and fatigue -Encouraged family/partner/community support of 4 hrs of uninterrupted sleep to help with mood and fatigue  5. Physical Recovery  - Discussed patients delivery and complications. She describes her labor as good. - Patient had a  vaginal delivery without complication . Patient had a 1st degree laceration. Perineal healing reviewed. Patient expressed understanding - Patient has urinary incontinence? No. - Patient is safe to resume physical and sexual activity  6.  Health Maintenance - HM due items addressed Yes - Last pap smear  Diagnosis  Date Value Ref Range Status  04/05/2022 - Low grade squamous intraepithelial lesion (LSIL) (A)  Final   Pap smear not done at today's visit. Will schedule colposcopy  -Breast Cancer screening indicated? No.   7. Chronic Disease/Pregnancy Condition follow up: None  - PCP follow up  Glenetta Borg, CNM [facility]

## 2023-01-16 ENCOUNTER — Other Ambulatory Visit (HOSPITAL_COMMUNITY)
Admission: RE | Admit: 2023-01-16 | Discharge: 2023-01-16 | Disposition: A | Discharge status: Home / Self Care | Payer: Medicaid Other | Source: Ambulatory Visit | Attending: Obstetrics & Gynecology | Admitting: Obstetrics & Gynecology

## 2023-01-16 ENCOUNTER — Encounter: Payer: Self-pay | Admitting: Obstetrics & Gynecology

## 2023-01-16 ENCOUNTER — Ambulatory Visit: Payer: Medicaid Other | Admitting: Obstetrics & Gynecology

## 2023-01-16 VITALS — BP 105/69 | HR 89 | Ht 60.0 in | Wt 165.0 lb

## 2023-01-16 DIAGNOSIS — Z23 Encounter for immunization: Secondary | ICD-10-CM | POA: Diagnosis not present

## 2023-01-16 DIAGNOSIS — N870 Mild cervical dysplasia: Secondary | ICD-10-CM

## 2023-01-16 DIAGNOSIS — R87612 Low grade squamous intraepithelial lesion on cytologic smear of cervix (LGSIL): Secondary | ICD-10-CM

## 2023-01-16 NOTE — Progress Notes (Signed)
    GYNECOLOGY PROGRESS NOTE  Subjective:    Patient ID: Susan Kent, female    DOB: 08/31/1995, 27 y.o.   MRN: [id number]  HPI  Patient is a 27 y.o. single [id number] here for a colpo because of a LGSIL pap 04/2022. Her pap prior to that was normal in 2021. She denies tobacco use. She uses Mirena for contraception.  The following portions of the patient's history were reviewed and updated as appropriate: allergies, current medications, past family history, past medical history, past social history, past surgical history, and problem list.  Review of Systems Pertinent items are noted in HPI.   Objective:   Blood pressure 105/69, pulse 89, height 5' (1.524 m), weight 165 lb (74.8 kg), currently breastfeeding. Body mass index is 32.22 kg/m. Well nourished, well hydrated Latina, no apparent distress  Consent signed, time out done Speculum placed. Mirena strings seen. Cervix prepped with acetic acid. Transformation zone seen in its entirety. Colpo adequate. Colposcopy all normal ECC obtained. She tolerated the procedure well.   Assessment:   LGSIL pap, normal colpo  Plan:   Await pathology. I discussed that she will need a pap in a year if her ECC is negative and a LEEP if ECC is abnormal. She received Gardasil #1 today, will get the other doses in 2 and 6 months.

## 2023-01-18 LAB — SURGICAL PATHOLOGY

## 2023-03-19 ENCOUNTER — Ambulatory Visit: Payer: Medicaid Other

## 2023-05-29 ENCOUNTER — Encounter: Payer: Self-pay | Admitting: Obstetrics & Gynecology

## 2023-07-16 ENCOUNTER — Ambulatory Visit

## 2023-07-17 ENCOUNTER — Ambulatory Visit: Payer: Medicaid Other

## 2023-07-26 ENCOUNTER — Ambulatory Visit

## 2023-07-26 VITALS — BP 109/67 | HR 68 | Ht 60.0 in | Wt 167.0 lb

## 2023-07-26 DIAGNOSIS — Z23 Encounter for immunization: Secondary | ICD-10-CM

## 2023-07-26 NOTE — Patient Instructions (Signed)
Human Papillomavirus (HPV) Vaccine Injection What is this medication? HUMAN PAPILLOMAVIRUS VACCINE (HYOO muhn pap uh LOH muh vahy ruhs vak SEEN) reduces the risk of human papillomavirus (HPV). It does not treat HPV. It is still possible to get HPV after receiving this vaccine, but the symptoms may be less severe or not last as long. It works by helping your immune system learn how to fight off a future infection. This medicine may be used for other purposes; ask your health care provider or pharmacist if you have questions. COMMON BRAND NAME(S): Gardasil 9 What should I tell my care team before I take this medication? They need to know if you have any of these conditions: Fever Hemophilia HIV or AIDS Immune system problems Infection Low platelets An unusual reaction to human papillomavirus vaccine, yeast, other vaccines, other medications, foods, dyes, or preservatives Pregnant or trying to get pregnant Breastfeeding How should I use this medication? This vaccine is injected into a muscle. It is given by your care team. This vaccine requires 2 or 3 doses to get the full benefit. Set a reminder for when your next dose is due. A copy of the Vaccine Information Statement will be given before each vaccination. Be sure to read this information carefully each time. This sheet may change often. Talk to your care team about the use of this medication in children. While it may be prescribed for children as young as 9 years for selected conditions, precautions do apply. Overdosage: If you think you have taken too much of this medicine contact a poison control center or emergency room at once. NOTE: This medicine is only for you. Do not share this medicine with others. What if I miss a dose? Keep appointments for follow-up doses as directed. It is important not to miss your dose. Call your care team if you are unable to keep an appointment. What may interact with this medication? Certain medications  for arthritis Medications for organ transplant Medications to treat cancer Steroid medications, such as prednisone or cortisone This list may not describe all possible interactions. Give your health care provider a list of all the medicines, herbs, non-prescription drugs, or dietary supplements you use. Also tell them if you smoke, drink alcohol, or use illegal drugs. Some items may interact with your medicine. What should I watch for while using this medication? Visit your care team regularly. Report any side effects to your care team right away. This vaccine, like all vaccines, may not fully protect everyone. What side effects may I notice from receiving this medication? Side effects that you should report to your care team as soon as possible: Allergic reactions--skin rash, itching, hives, swelling of the face, lips, tongue, or throat Feeling faint or lightheaded Side effects that usually do not require medical attention (report these to your care team if they continue or are bothersome): Diarrhea Dizziness Fatigue Fever Headache Nausea Pain, redness, irritation, or bruising at the injection site This list may not describe all possible side effects. Call your doctor for medical advice about side effects. You may report side effects to FDA at 1-800-FDA-1088. Where should I keep my medication? This vaccine is only given by your care team. It will not be stored at home. NOTE: This sheet is a summary. It may not cover all possible information. If you have questions about this medicine, talk to your doctor, pharmacist, or health care provider.  2024 Elsevier/Gold Standard (2021-08-31 00:00:00)

## 2023-07-26 NOTE — Progress Notes (Signed)
    NURSE VISIT NOTE  Subjective:    Patient ID: Susan Kent, female    DOB: 01/15/1996, 28 y.o.   MRN: [id number]  HPI  Patient is a 28 y.o. [id number] female Significant Other Hispanic female who presents for her second Gardasil injection. Order to administer given by Stephane Ee, MD on 01/16/23.   Objective:    BP 109/67   Pulse 68   Ht 5' (1.524 m)   Wt 167 lb (75.8 kg)   BMI 32.61 kg/m   28 y.o. LMP:  07/19/23  Contraception:  *Mirena  Given by: Desmond Florida, LPN Site:  left deltoid  Lab Review  No results found for any visits on 07/26/23.    Assessment:   No diagnosis found.   Plan:   Patient will return in 12 weeks for third injection.    Desmond Florida, LPN

## 2023-10-18 ENCOUNTER — Ambulatory Visit

## 2023-10-18 DIAGNOSIS — Z23 Encounter for immunization: Secondary | ICD-10-CM

## 2023-12-24 ENCOUNTER — Encounter: Payer: Self-pay | Admitting: Obstetrics & Gynecology

## 2023-12-24 ENCOUNTER — Ambulatory Visit: Admitting: Obstetrics & Gynecology

## 2023-12-24 ENCOUNTER — Other Ambulatory Visit (HOSPITAL_COMMUNITY)
Admission: RE | Admit: 2023-12-24 | Discharge: 2023-12-24 | Disposition: A | Discharge status: Home / Self Care | Source: Ambulatory Visit | Attending: Obstetrics & Gynecology | Admitting: Obstetrics & Gynecology

## 2023-12-24 VITALS — BP 111/69 | HR 73 | Ht 60.0 in | Wt 176.0 lb

## 2023-12-24 DIAGNOSIS — R87612 Low grade squamous intraepithelial lesion on cytologic smear of cervix (LGSIL): Secondary | ICD-10-CM

## 2023-12-24 DIAGNOSIS — N870 Mild cervical dysplasia: Secondary | ICD-10-CM | POA: Diagnosis not present

## 2023-12-24 DIAGNOSIS — Z3202 Encounter for pregnancy test, result negative: Secondary | ICD-10-CM

## 2023-12-24 LAB — POCT URINE PREGNANCY: Preg Test, Ur: NEGATIVE

## 2023-12-24 NOTE — Progress Notes (Signed)
    GYNECOLOGY PROGRESS NOTE  Subjective:    Patient ID: Susan Kent, female    DOB: [birth year], 28 y.o.   MRN: [id number]  HPI  Patient is a 28 y.o. G59P2012 female who presents for a colpo. I saw her a year ago for a colpo because of a lgsil pap . Her colpo was normal but the ECC showed some lgsil cells. I rec'd a LEEP but she never came in. I will evaluate with a colpo again in the hopes that I can avoid a LEEP.    She has a Mirena  and reports monthly periods that last 3 days.  The following portions of the patient's history were reviewed and updated as appropriate: allergies, current medications, past family history, past medical history, past social history, past surgical history, and problem list.  Review of Systems Pertinent items are noted in HPI.   Objective:   Blood pressure 111/69, pulse 73, height 5' (1.524 m), weight 176 lb (79.8 kg), last menstrual period 11/24/2023, currently breastfeeding. Body mass index is 34.37 kg/m. Well nourished, well hydrated Latina, no apparent distress  UPT negative, consent signed, time out done Speculum placed. I could see the Mirena  strings, about 2 cm long Cervix prepped with acetic acid. Transformation zone seen in its entirety. Colpo adequate. Colposcopic findings normal. ECC obtained. She tolerated the procedure well.  After the procedure the nurse noted that the previously negative UPT had a faint positive line.   Assessment:  H/O LGSIL pap and h/o + ECC   Plan:   Await pathology of ECC QBHCG ordered

## 2023-12-25 LAB — BETA HCG QUANT (REF LAB): hCG Quant: 16 m[IU]/mL

## 2023-12-26 ENCOUNTER — Encounter: Payer: Self-pay | Admitting: Obstetrics & Gynecology

## 2023-12-26 ENCOUNTER — Ambulatory Visit (INDEPENDENT_AMBULATORY_CARE_PROVIDER_SITE_OTHER): Admitting: Obstetrics & Gynecology

## 2023-12-26 ENCOUNTER — Other Ambulatory Visit: Payer: Self-pay | Admitting: Obstetrics & Gynecology

## 2023-12-26 VITALS — BP 119/68 | HR 76 | Ht 60.0 in | Wt 177.0 lb

## 2023-12-26 DIAGNOSIS — Z30432 Encounter for removal of intrauterine contraceptive device: Secondary | ICD-10-CM | POA: Diagnosis not present

## 2023-12-26 DIAGNOSIS — O3680X0 Pregnancy with inconclusive fetal viability, not applicable or unspecified: Secondary | ICD-10-CM

## 2023-12-26 LAB — BETA HCG QUANT (REF LAB): hCG Quant: 48 m[IU]/mL

## 2023-12-26 LAB — SURGICAL PATHOLOGY

## 2023-12-26 NOTE — Progress Notes (Signed)
    GYNECOLOGY PROGRESS NOTE  Subjective:    Patient ID: Rosabell Geyer, female    DOB: 02/10/1996, 28 y.o.   MRN: [id number]  HPI  Patient is a 28 y.o. [id number] here for IUD removal. She had a colpo 2 days ago. Prior to the test, the UPT was negative. After the test, there was a faint positive line (about 15 minutes). I did a QBHCG and it was 16. At the time of her colpo I noted the IUD to be rather low (I could see the distal end of the IUD when manipulating her cervix). She told me at that visit that her period was due the following day (She has monthly cycles) and it did start yesterday.  The following portions of the patient's history were reviewed and updated as appropriate: allergies, current medications, past family history, past medical history, past social history, past surgical history, and problem list.  Review of Systems Pertinent items are noted in HPI.   Objective:   Blood pressure 119/68, pulse 76, height 5' (1.524 m), weight 177 lb (80.3 kg), last menstrual period 12/25/2023, not currently breastfeeding. Body mass index is 34.57 kg/m. Well nourished, well hydrated Latina, no apparent distress  I placed a Graves speculum and noted that she was having moderate bleeding. I grasped the IUD strings and applied gentle traction. The distal tip of the Mirena  became visible but it was firmly stuck in the cervix/uterus. I used two uterine dressing forceps and gently advanced up to the more proximal part of the IUD and was finally able to remove the intact Mirena . She tolerated the procedure well.   Assessment:   1. Pregnancy of unknown anatomic location      Plan:   1. Pregnancy of unknown anatomic location (Primary)  - B-HCG Quant stat today  I explained to her that if the value doubles, then a pregnancy/baby is likely. If it decreases, then I expect that this was a chemical pregnancy/no baby. If it remains unchanged by much, then I would recommend methotrexate.

## 2023-12-26 NOTE — Progress Notes (Signed)
 BHCG went from 16 to 48 in 48 hours.  I will have it drawn again in 48 hours.

## 2023-12-28 ENCOUNTER — Other Ambulatory Visit (HOSPITAL_COMMUNITY): Payer: Self-pay | Admitting: Obstetrics & Gynecology

## 2023-12-28 ENCOUNTER — Other Ambulatory Visit

## 2023-12-28 DIAGNOSIS — O3680X0 Pregnancy with inconclusive fetal viability, not applicable or unspecified: Secondary | ICD-10-CM

## 2023-12-28 LAB — BETA HCG QUANT (REF LAB): hCG Quant: 125 m[IU]/mL

## 2023-12-31 ENCOUNTER — Other Ambulatory Visit: Payer: Self-pay | Admitting: Obstetrics & Gynecology

## 2023-12-31 ENCOUNTER — Ambulatory Visit: Payer: Self-pay | Admitting: Obstetrics & Gynecology

## 2023-12-31 DIAGNOSIS — O3680X0 Pregnancy with inconclusive fetal viability, not applicable or unspecified: Secondary | ICD-10-CM

## 2023-12-31 NOTE — Progress Notes (Signed)
 Pelvic ultrasound ordered.

## 2024-01-07 ENCOUNTER — Ambulatory Visit (INDEPENDENT_AMBULATORY_CARE_PROVIDER_SITE_OTHER)

## 2024-01-07 DIAGNOSIS — N898 Other specified noninflammatory disorders of vagina: Secondary | ICD-10-CM | POA: Diagnosis not present

## 2024-01-09 ENCOUNTER — Other Ambulatory Visit: Payer: Self-pay | Admitting: Obstetrics & Gynecology

## 2024-01-09 ENCOUNTER — Encounter: Payer: Self-pay | Admitting: Obstetrics & Gynecology

## 2024-01-09 DIAGNOSIS — O3680X0 Pregnancy with inconclusive fetal viability, not applicable or unspecified: Secondary | ICD-10-CM

## 2024-01-09 NOTE — Telephone Encounter (Signed)
 Called patient to get her scheduled for lab appointment only. Scheduled for 01/10/24.

## 2024-01-10 ENCOUNTER — Other Ambulatory Visit

## 2024-01-10 DIAGNOSIS — O3680X0 Pregnancy with inconclusive fetal viability, not applicable or unspecified: Secondary | ICD-10-CM

## 2024-01-10 NOTE — Addendum Note (Signed)
 Addended by: TAFT CAMELIA MATSU on: 01/10/2024 10:00 AM   Modules accepted: Orders

## 2024-01-11 LAB — BETA HCG QUANT (REF LAB): hCG Quant: 1228 m[IU]/mL

## 2024-01-14 ENCOUNTER — Other Ambulatory Visit: Payer: Self-pay

## 2024-01-14 DIAGNOSIS — O3680X0 Pregnancy with inconclusive fetal viability, not applicable or unspecified: Secondary | ICD-10-CM

## 2024-01-14 NOTE — Addendum Note (Signed)
 Addended by: VICCI ROLLO BRAVO on: 01/14/2024 01:58 PM   Modules accepted: Orders

## 2024-01-15 ENCOUNTER — Other Ambulatory Visit

## 2024-01-15 DIAGNOSIS — O3680X0 Pregnancy with inconclusive fetal viability, not applicable or unspecified: Secondary | ICD-10-CM

## 2024-01-16 LAB — BETA HCG QUANT (REF LAB): hCG Quant: 3307 m[IU]/mL

## 2024-01-21 ENCOUNTER — Encounter: Payer: Self-pay | Admitting: Obstetrics & Gynecology

## 2024-01-21 ENCOUNTER — Other Ambulatory Visit: Payer: Self-pay | Admitting: Obstetrics & Gynecology

## 2024-01-21 DIAGNOSIS — O3680X0 Pregnancy with inconclusive fetal viability, not applicable or unspecified: Secondary | ICD-10-CM

## 2024-01-22 ENCOUNTER — Ambulatory Visit (INDEPENDENT_AMBULATORY_CARE_PROVIDER_SITE_OTHER)

## 2024-01-22 DIAGNOSIS — N898 Other specified noninflammatory disorders of vagina: Secondary | ICD-10-CM

## 2024-02-04 ENCOUNTER — Other Ambulatory Visit: Payer: Self-pay | Admitting: Obstetrics & Gynecology

## 2024-02-04 ENCOUNTER — Encounter
Admission: EM | Disposition: A | Discharge status: Home / Self Care | Payer: Self-pay | Source: Ambulatory Visit | Attending: Emergency Medicine

## 2024-02-04 ENCOUNTER — Encounter: Payer: Self-pay | Admitting: Anesthesiology

## 2024-02-04 ENCOUNTER — Emergency Department

## 2024-02-04 ENCOUNTER — Emergency Department
Admission: EM | Admit: 2024-02-04 | Discharge: 2024-02-04 | Disposition: A | Discharge status: Home / Self Care | Source: Ambulatory Visit | Attending: Emergency Medicine | Admitting: Emergency Medicine

## 2024-02-04 DIAGNOSIS — O209 Hemorrhage in early pregnancy, unspecified: Secondary | ICD-10-CM | POA: Diagnosis present

## 2024-02-04 DIAGNOSIS — O00102 Left tubal pregnancy without intrauterine pregnancy: Secondary | ICD-10-CM

## 2024-02-04 DIAGNOSIS — Z3A01 Less than 8 weeks gestation of pregnancy: Secondary | ICD-10-CM | POA: Diagnosis not present

## 2024-02-04 DIAGNOSIS — O3680X0 Pregnancy with inconclusive fetal viability, not applicable or unspecified: Secondary | ICD-10-CM

## 2024-02-04 DIAGNOSIS — O009 Unspecified ectopic pregnancy without intrauterine pregnancy: Secondary | ICD-10-CM | POA: Insufficient documentation

## 2024-02-04 LAB — CBC WITH DIFFERENTIAL/PLATELET
Abs Immature Granulocytes: 0.02 K/uL (ref 0.00–0.07)
Basophils Absolute: 0.1 K/uL (ref 0.0–0.1)
Basophils Relative: 1 %
Eosinophils Absolute: 0.4 K/uL (ref 0.0–0.5)
Eosinophils Relative: 4 %
HCT: 38.7 % (ref 36.0–46.0)
Hemoglobin: 13.2 g/dL (ref 12.0–15.0)
Immature Granulocytes: 0 %
Lymphocytes Relative: 41 %
Lymphs Abs: 4 K/uL (ref 0.7–4.0)
MCH: 28.6 pg (ref 26.0–34.0)
MCHC: 34.1 g/dL (ref 30.0–36.0)
MCV: 83.9 fL (ref 80.0–100.0)
Monocytes Absolute: 0.6 K/uL (ref 0.1–1.0)
Monocytes Relative: 6 %
Neutro Abs: 4.8 K/uL (ref 1.7–7.7)
Neutrophils Relative %: 48 %
Platelets: 367 K/uL (ref 150–400)
RBC: 4.61 MIL/uL (ref 3.87–5.11)
RDW: 13.2 % (ref 11.5–15.5)
WBC: 9.9 K/uL (ref 4.0–10.5)
nRBC: 0 % (ref 0.0–0.2)

## 2024-02-04 LAB — HCG, QUANTITATIVE, PREGNANCY: hCG, Beta Chain, Quant, S: 14300 m[IU]/mL — ABNORMAL HIGH (ref <5)

## 2024-02-04 LAB — COMPREHENSIVE METABOLIC PANEL WITH GFR
ALT: 21 U/L (ref 0–44)
AST: 22 U/L (ref 15–41)
Albumin: 4 g/dL (ref 3.5–5.0)
Alkaline Phosphatase: 81 U/L (ref 38–126)
Anion gap: 9 (ref 5–15)
BUN: 8 mg/dL (ref 6–20)
CO2: 23 mmol/L (ref 22–32)
Calcium: 9.6 mg/dL (ref 8.9–10.3)
Chloride: 106 mmol/L (ref 98–111)
Creatinine, Ser: 0.38 mg/dL — ABNORMAL LOW (ref 0.44–1.00)
GFR, Estimated: >60 mL/min (ref >60)
Glucose, Bld: 91 mg/dL (ref 70–99)
Potassium: 3.7 mmol/L (ref 3.5–5.1)
Sodium: 138 mmol/L (ref 135–145)
Total Bilirubin: 0.4 mg/dL (ref 0.0–1.2)
Total Protein: 7.5 g/dL (ref 6.5–8.1)

## 2024-02-04 LAB — ABO/RH: ABO/RH(D): O POS

## 2024-02-04 SURGERY — LAPAROSCOPY, WITH ECTOPIC PREGNANCY SURGICAL TREATMENT
Anesthesia: Choice

## 2024-02-04 MED ORDER — BUPIVACAINE HCL (PF) 0.5 % IJ SOLN
INTRAMUSCULAR | Status: AC
Start: 2024-02-04 — End: 2024-02-04
  Filled 2024-02-04: qty 30

## 2024-02-04 MED ORDER — METHOTREXATE FOR ECTOPIC PREGNANCY
50.0000 mg/m2 | Freq: Once | INTRAMUSCULAR | Status: AC
  Administered 2024-02-04: 92.5 mg via INTRAMUSCULAR
  Filled 2024-02-04: qty 3.7

## 2024-02-04 NOTE — ED Triage Notes (Signed)
 Pt presents to the ED via POV from home with significant other. Pt reports first day of LMP to be 9/23. States that she has an IUD. Pt has been seeing OBGYN. Pt has had continued increase in her hcg levels. States that she has had two ultrasounds without indication of an IUP. Pt states that she was sent here by her OB to get repeated HCG levels and methotrexate shot. Reports mild abdominal cramping and light vaginal bleeding.

## 2024-02-04 NOTE — Progress Notes (Signed)
 Follow up [facility] ordered for day 4 and 7

## 2024-02-04 NOTE — Consult Note (Signed)
 Subjective:   Patient is a 28 y.o. married P3 here with a pregnancy of unknown location. She was found to have a + UPT at the time of a colposcopy. At that time she had an IUD in place, but it was low in the uterus/cervix. I removed it at the time of the + UPT. I followed her QBHCGs and they rose appropriately. Her pelvic ultrasound did not show any pregnancy anywhere and I asked her to come to the ED today to follow up. She has been having periods since the time of her colpo (was bleeding at that time). She denies any pain today other than some midline cramping.  She and her husband do not want any future pregnancies.  Her hbg today is 13.2 and QBHCG is 14, 300.  Patient Active Problem List   Diagnosis Date Noted   Postpartum care following vaginal delivery 10/30/2022   Encounter for care or examination of lactating mother 10/30/2022   Labor and delivery, indication for care 10/27/2022   LGSIL on Pap smear of cervix 08/22/2022   Obesity affecting pregnancy 06/27/2022   Supervision of other normal pregnancy, antepartum 04/09/2015   Rubella non-immune status, antepartum 02/17/2015   Past Medical History:  Diagnosis Date   History of anemia    Postpartum hemorrhage, delivered, current hospitalization 09/05/2015    Past Surgical History:  Procedure Laterality Date   CHOLECYSTECTOMY N/A 07/28/2016   Procedure: LAPAROSCOPIC CHOLECYSTECTOMY;  Surgeon: Aloysius Plant, MD;  Location: [facility] ORS;  Service: General;  Laterality: N/A;   FEMUR FRACTURE SURGERY  2020    (Not in a hospital admission)  No Known Allergies  Social History   Tobacco Use   Smoking status: Never   Smokeless tobacco: Never  Substance Use Topics   Alcohol use: Not Currently    Comment: Last ETOH use end of 05/2021    Family History  Problem Relation Age of Onset   Hypertension Mother    Migraines Mother    Cancer - Cervical Mother 68   Healthy Father    Healthy Sister    Healthy Sister    Diabetes Sister     Hypertension Sister    Healthy Brother    Multiple sclerosis Brother    Healthy Brother    Healthy Brother    Healthy Brother    Healthy Maternal Grandmother    Healthy Maternal Grandfather    Healthy Paternal Grandmother    Healthy Paternal Grandfather     Review of Systems Pertinent items are noted in HPI.  Objective:   Patient Vitals for the past 8 hrs:  BP Temp Temp src Pulse Resp SpO2 Height Weight  02/04/24 1508 (!) 151/89 97.8 F (36.6 C) Oral 80 18 99 % 4' 11 (1.499 m) 80 kg   No intake/output data recorded. No intake/output data recorded.  TVS shows a complex on adjacent to the left adnexa c/w ectopic, no FHR, no yolk sac, no fetus  BP (!) 151/89   Pulse 80   Temp 97.8 F (36.6 C) (Oral)   Resp 18   Ht 4' 11 (1.499 m)   Wt 80 kg   LMP 12/25/2023   SpO2 99%   BMI 35.62 kg/m   General Appearance:    Alert, cooperative, no distress, appears stated age  Head:    Normocephalic, without obvious abnormality, atraumatic  Eyes:    PERRL, conjunctiva/corneas clear, EOM's intact, fundi    benign, both eyes  Ears:    Normal TM's and external ear  canals, both ears  Nose:   Nares normal, septum midline, mucosa normal, no drainage    or sinus tenderness  Throat:   Lips, mucosa, and tongue normal; teeth and gums normal  Neck:   Supple, symmetrical, trachea midline, no adenopathy;    thyroid:  no enlargement/tenderness/nodules; no carotid   bruit or JVD  Back:     Symmetric, no curvature, ROM normal, no CVA tenderness  Lungs:     Clear to auscultation bilaterally, respirations unlabored  Chest Wall:    No tenderness or deformity   Heart:    Regular rate and rhythm, S1 and S2 normal, no murmur, rub   or gallop  Breast Exam:    No tenderness, masses, or nipple abnormality  Abdomen:     Soft, non-tender, bowel sounds active all four quadrants,    no masses, no organomegaly        Extremities:   Extremities normal, atraumatic, no cyanosis or edema  Pulses:   2+ and  symmetric all extremities  Skin:   Skin color, texture, turgor normal, no rashes or lesions  Lymph nodes:   Cervical, supraclavicular, and axillary nodes normal  Neurologic:   CNII-XII intact, normal strength, sensation and reflexes    throughout       Assessment:   Ectopic pregnancy with benign exam Unwanted fertility with Medicaid   Plan:   Because she wants both tubes removed for permanent sterility, I cannot do this until her medicaid forms have been signed for 30 days. I could do laparoscopy today and remove her left tube, but she would prefer to only have 1 surgery if possible, not one today and then another tube removal in 31 days.  She understands that methotrexate is an option but that with a QBHCG of 14,000 she may likely need another shot next week if her [facility] does not fall appropriately.We (including her husband) have discussed the signs/symptoms of rupture at length. She understands pain precautions and will come in to the ED asap if significant pain occurs.

## 2024-02-04 NOTE — ED Provider Notes (Signed)
 [facility] Provider Note    Event Date/Time   First MD Initiated Contact with Patient 02/04/24 1546     (approximate)   History   Vaginal Bleeding   HPI  Susan Kent is a 28 y.o. female who presents to the emergency department today because of concerns for probable ectopic pregnancy.  The patient has been followed by her outpatient OB/GYN provider for increasing beta hCGs over the past few weeks.  She has had ultrasound which has not identified location of pregnancy.  She does complain of some abdominal cramping and vaginal bleeding.     Physical Exam   Triage Vital Signs: ED Triage Vitals [02/04/24 1508]  Encounter Vitals Group     BP (!) 151/89     Girls Systolic BP Percentile      Girls Diastolic BP Percentile      Boys Systolic BP Percentile      Boys Diastolic BP Percentile      Pulse Rate 80     Resp 18     Temp 97.8 F (36.6 C)     Temp Source Oral     SpO2 99 %     Weight 176 lb 5.9 oz (80 kg)     Height 4' 11 (1.499 m)     Head Circumference      Peak Flow      Pain Score 0     Pain Loc      Pain Education      Exclude from Growth Chart     Most recent vital signs: Vitals:   02/04/24 1508  BP: (!) 151/89  Pulse: 80  Resp: 18  Temp: 97.8 F (36.6 C)  SpO2: 99%   General: Awake, alert, oriented. CV:  Good peripheral perfusion. Regular rate and rhythm. Resp:  Normal effort. Lungs clear. Abd:  No distention. Non tender.  ED Results / Procedures / Treatments   Labs (all labs ordered are listed, but only abnormal results are displayed) Labs Reviewed  HCG, QUANTITATIVE, PREGNANCY - Abnormal; Notable for the following components:      Result Value   hCG, Beta Chain, Quant, S 14,300 (*)    All other components within normal limits  COMPREHENSIVE METABOLIC PANEL WITH GFR - Abnormal; Notable for the following components:   Creatinine, Ser 0.38 (*)    All other components within normal limits  CBC WITH  DIFFERENTIAL/PLATELET  URINALYSIS, ROUTINE W REFLEX MICROSCOPIC  POC URINE PREG, ED  ABO/RH     EKG  None   RADIOLOGY US  OB transvaginal IMPRESSION:  1. No evidence of intrauterine gestational sac, yolk sac, or fetal pole.  2. Complex solid left adnexal lesion centered adjacent to the ovary with marked  hyperemia, highly suspicious for ectopic pregnancy.  3. A return call from the clinical service is pending.   I, Guadalupe Eagles, personally discussed these images and results by phone with the on-call radiologist and used this discussion as part of my medical decision making.     PROCEDURES:  Critical Care performed: No   MEDICATIONS ORDERED IN ED: Medications - No data to display   IMPRESSION / MDM / ASSESSMENT AND PLAN / ED COURSE  I reviewed the triage vital signs and the nursing notes.                              Differential diagnosis includes, but is not limited to, ectopic pregnancy, IUP,  neoplasm  Patient's presentation is most consistent with acute presentation with potential threat to life or bodily function.   Patient presented to the emergency department today at the advice of OB/GYN because of concern for probable ectopic pregnancy of unknown location.  Beta-hCG today continues to elevate.  Dr. Starla with OB/GYN evaluated patient emergency department.  Ordered stat ultrasound for further evaluation.  US  is concerning for ectopic pregnancy. Dr. Starla did order methotrexate. Will defer surgical intervention at this time. Will follow up in clinic.      FINAL CLINICAL IMPRESSION(S) / ED DIAGNOSES   Final diagnoses:  Ectopic pregnancy, unspecified location, unspecified whether intrauterine pregnancy present       Note:  This document was prepared using Dragon voice recognition software and may include unintentional dictation errors.    Floy Roberts, MD 02/04/24 [id number]

## 2024-02-04 NOTE — ED Notes (Signed)
 Patient transported to Ultrasound

## 2024-02-11 ENCOUNTER — Other Ambulatory Visit

## 2024-02-11 ENCOUNTER — Telehealth: Payer: Self-pay | Admitting: Obstetrics & Gynecology

## 2024-02-11 DIAGNOSIS — O00102 Left tubal pregnancy without intrauterine pregnancy: Secondary | ICD-10-CM

## 2024-02-11 NOTE — Telephone Encounter (Signed)
 I sent her a text message over the weekend and again today, telling her that she needs to get her [facility] drawn. I just called now and got no answer.

## 2024-02-12 ENCOUNTER — Encounter: Payer: Self-pay | Admitting: Obstetrics & Gynecology

## 2024-02-12 LAB — BETA HCG QUANT (REF LAB): hCG Quant: 4008 m[IU]/mL

## 2024-02-18 ENCOUNTER — Other Ambulatory Visit

## 2024-02-18 ENCOUNTER — Encounter: Payer: Self-pay | Admitting: Obstetrics & Gynecology

## 2024-02-18 ENCOUNTER — Other Ambulatory Visit: Payer: Self-pay

## 2024-02-18 DIAGNOSIS — O00102 Left tubal pregnancy without intrauterine pregnancy: Secondary | ICD-10-CM

## 2024-02-19 ENCOUNTER — Ambulatory Visit: Payer: Self-pay | Admitting: Obstetrics & Gynecology

## 2024-02-19 LAB — BETA HCG QUANT (REF LAB): hCG Quant: 1385 m[IU]/mL

## 2024-02-25 ENCOUNTER — Other Ambulatory Visit: Payer: Self-pay | Admitting: Obstetrics & Gynecology

## 2024-02-25 DIAGNOSIS — O00102 Left tubal pregnancy without intrauterine pregnancy: Secondary | ICD-10-CM

## 2024-02-26 ENCOUNTER — Other Ambulatory Visit: Payer: Self-pay | Admitting: Obstetrics & Gynecology

## 2024-03-10 ENCOUNTER — Encounter: Payer: Self-pay | Admitting: Obstetrics & Gynecology

## 2024-04-18 ENCOUNTER — Other Ambulatory Visit

## 2024-04-18 DIAGNOSIS — O00102 Left tubal pregnancy without intrauterine pregnancy: Secondary | ICD-10-CM

## 2024-04-19 LAB — BETA HCG QUANT (REF LAB): hCG Quant: 2 m[IU]/mL

## 2024-04-21 ENCOUNTER — Ambulatory Visit: Payer: Self-pay | Admitting: Obstetrics & Gynecology
